# Patient Record
Sex: Male | Born: 1940 | Race: White | Hispanic: No | Marital: Married | Smoking: Former smoker
Health system: Southern US, Community
[De-identification: ages and names within clinical notes are randomized; demographics above are authoritative.]

## PROBLEM LIST (undated history)

## (undated) DIAGNOSIS — I10 Essential (primary) hypertension: Secondary | ICD-10-CM

## (undated) DIAGNOSIS — Z950 Presence of cardiac pacemaker: Secondary | ICD-10-CM

## (undated) DIAGNOSIS — I495 Sick sinus syndrome: Secondary | ICD-10-CM

## (undated) DIAGNOSIS — E785 Hyperlipidemia, unspecified: Secondary | ICD-10-CM

## (undated) DIAGNOSIS — H409 Unspecified glaucoma: Secondary | ICD-10-CM

## (undated) HISTORY — DX: Sick sinus syndrome: I49.5

## (undated) HISTORY — DX: Presence of cardiac pacemaker: Z95.0

---

## 2006-05-13 ENCOUNTER — Emergency Department (HOSPITAL_COMMUNITY): Admission: EM | Admit: 2006-05-13 | Discharge: 2006-05-13 | Payer: Self-pay | Admitting: Emergency Medicine

## 2006-10-14 ENCOUNTER — Encounter: Admission: RE | Admit: 2006-10-14 | Discharge: 2006-10-14 | Payer: Self-pay | Admitting: *Deleted

## 2008-07-14 ENCOUNTER — Encounter: Admission: RE | Admit: 2008-07-14 | Discharge: 2008-07-14 | Payer: Self-pay | Admitting: Family Medicine

## 2008-07-22 ENCOUNTER — Emergency Department (HOSPITAL_COMMUNITY): Admission: EM | Admit: 2008-07-22 | Discharge: 2008-07-22 | Payer: Self-pay | Admitting: Emergency Medicine

## 2008-08-10 ENCOUNTER — Encounter: Admission: RE | Admit: 2008-08-10 | Discharge: 2008-08-10 | Payer: Self-pay | Admitting: Rheumatology

## 2008-08-17 ENCOUNTER — Encounter (INDEPENDENT_AMBULATORY_CARE_PROVIDER_SITE_OTHER): Payer: Self-pay | Admitting: General Surgery

## 2008-08-17 ENCOUNTER — Ambulatory Visit (HOSPITAL_BASED_OUTPATIENT_CLINIC_OR_DEPARTMENT_OTHER): Admission: RE | Admit: 2008-08-17 | Discharge: 2008-08-17 | Payer: Self-pay | Admitting: General Surgery

## 2009-10-26 IMAGING — CT CT CHEST W/ CM
2 of 5 series · 13 of 32 positions shown, 18 images · IV contrast (30CC OMNI 350 & 80CC OMNI 300)
Comparison: None

CT CHEST

CLINICAL DATA: Fever of unknown origin and unexpected weight loss.

CT CHEST, ABDOMEN AND PELVIS WITH CONTRAST
TECHNIQUE: Multidetector CT imaging of the chest, abdomen and
pelvis was performed following the standard protocol during bolus
administration of intravenous contrast.
Contrast: 80 ml intravenous Bmnipaque-FVV

[Series 400: coronal · coronal · 1.21mm/px · 5 of 107 slices shown]
[im 18/107  soft-tissue]
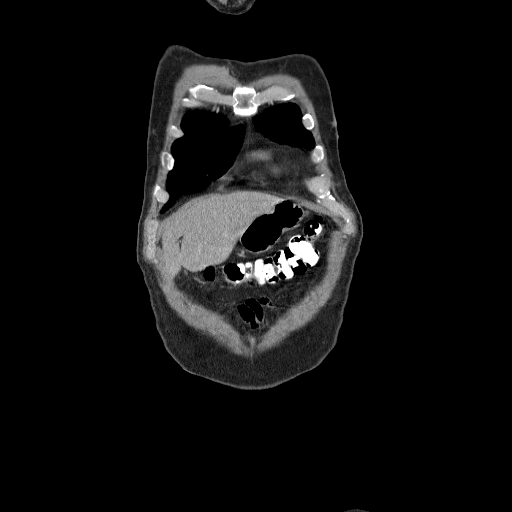
[im 36/107  soft-tissue]
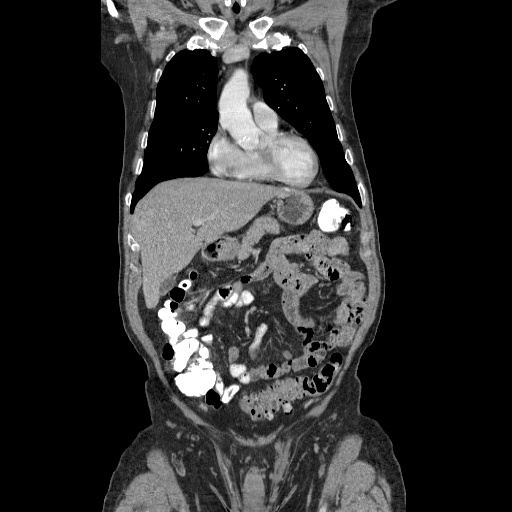
[im 54/107  soft-tissue]
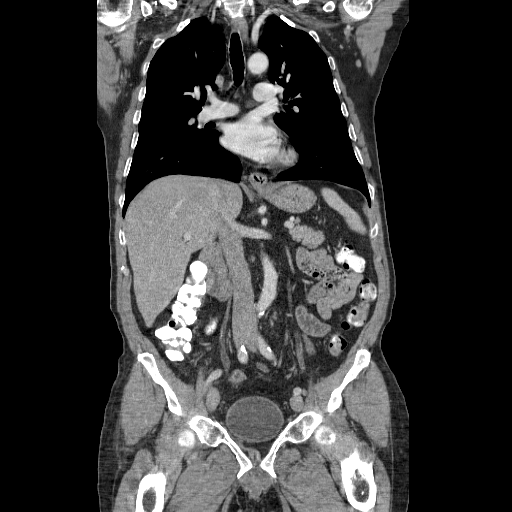
[im 71/107  soft-tissue]
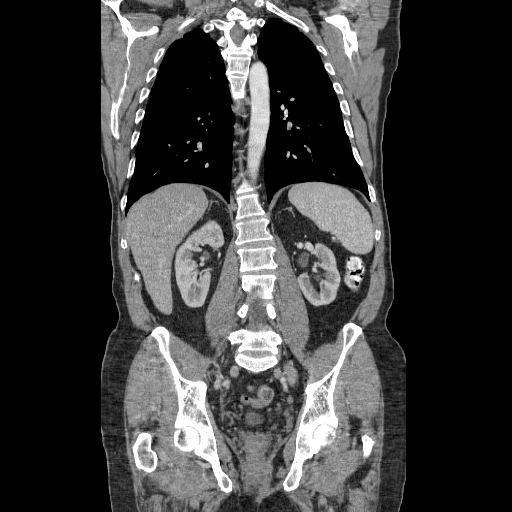
[im 89/107  soft-tissue]
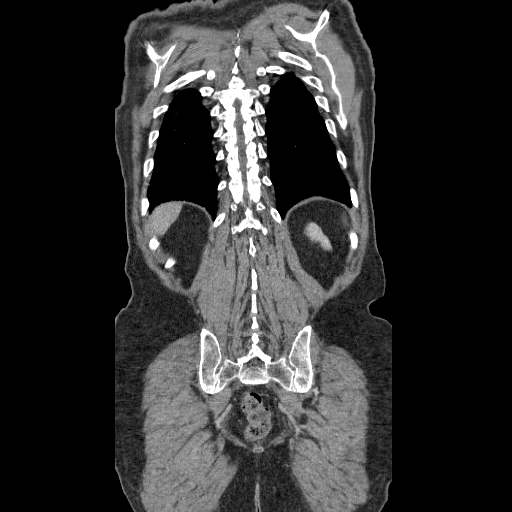

[Series 401: sagittal · sagittal · 1.21mm/px · 8 of 146 slices shown, 13 images]
[im 17/146  soft-tissue]
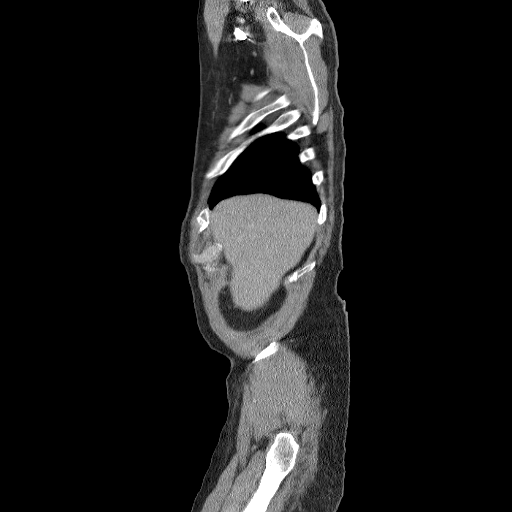
[im 17/146  lung]
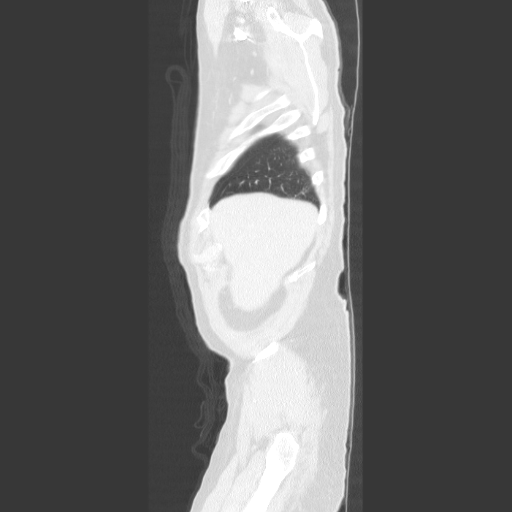
[im 17/146  bone]
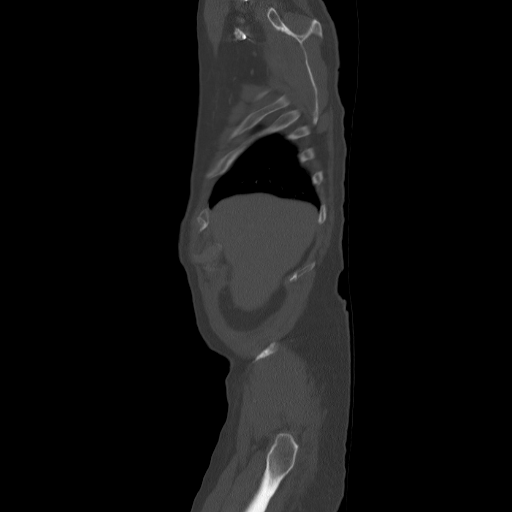
[im 33/146  soft-tissue]
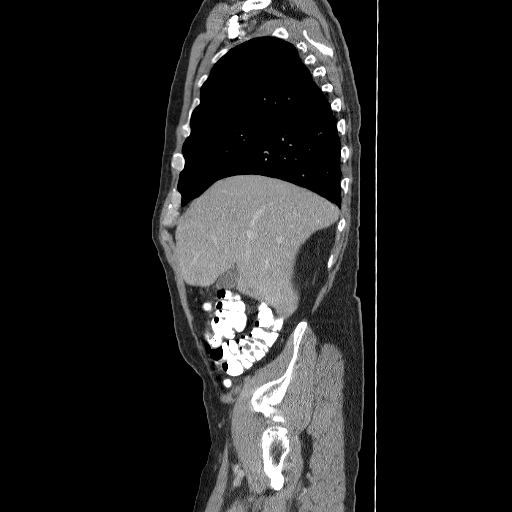
[im 33/146  lung]
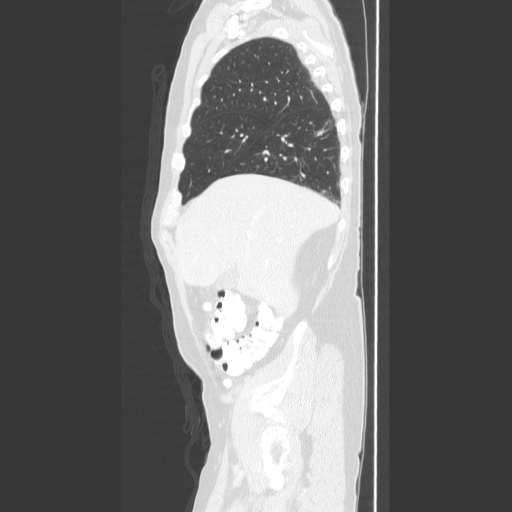
[im 49/146  soft-tissue]
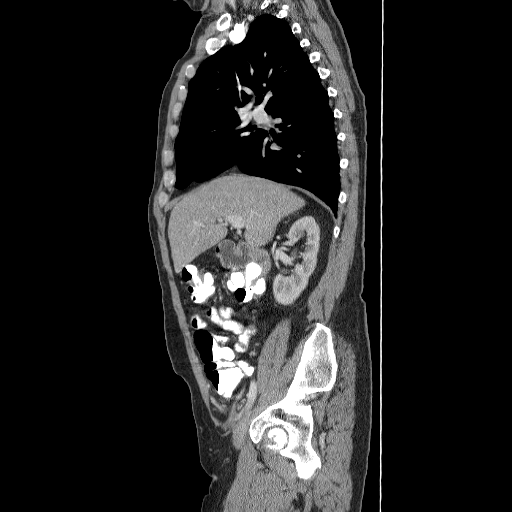
[im 49/146  lung]
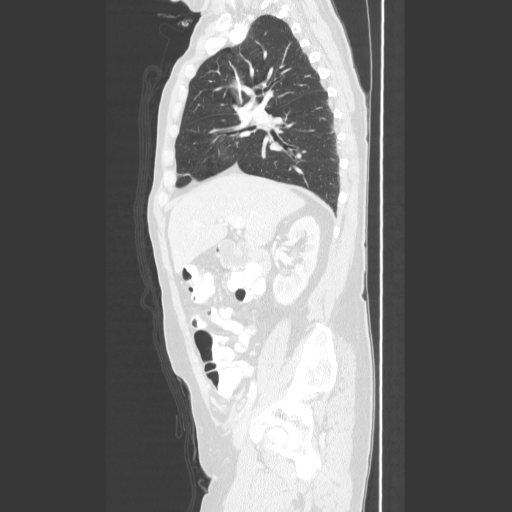
[im 65/146  soft-tissue]
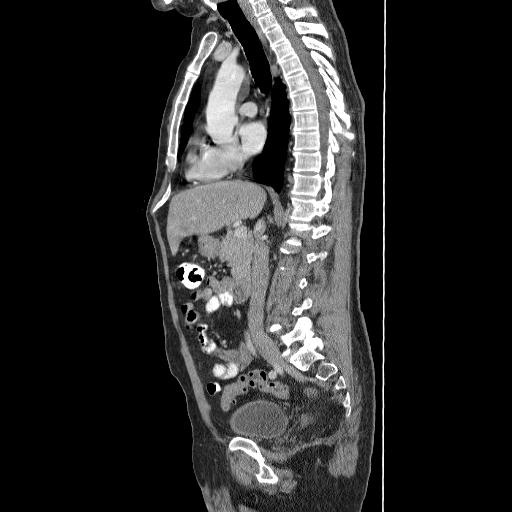
[im 65/146  lung]
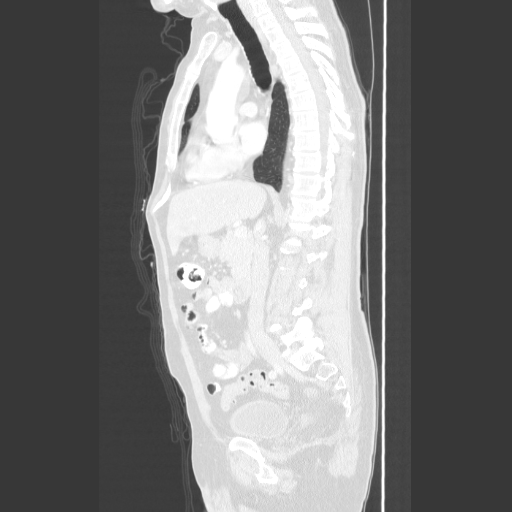
[im 81/146  soft-tissue]
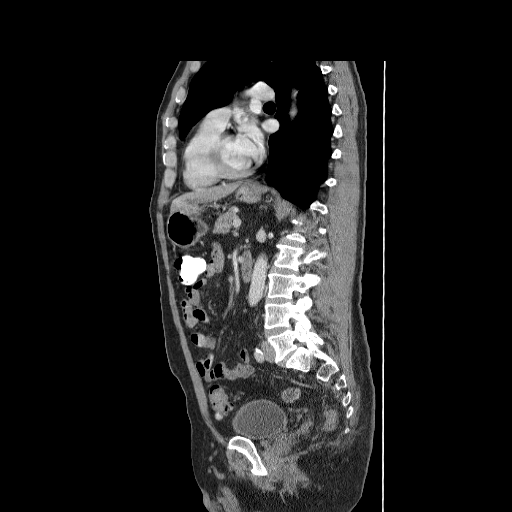
[im 97/146  soft-tissue]
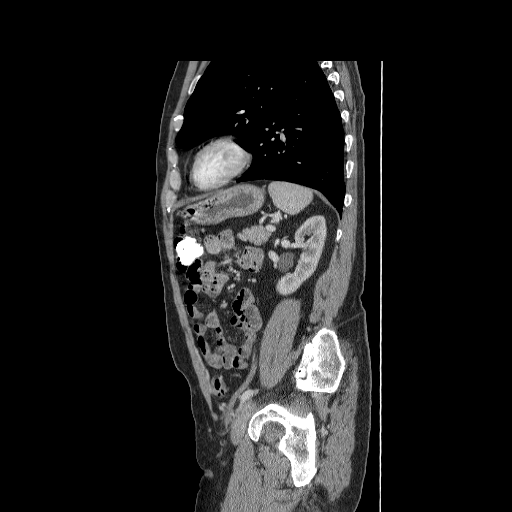
[im 113/146  soft-tissue]
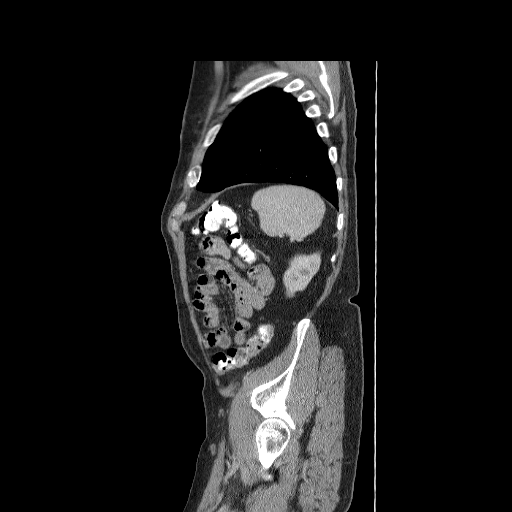
[im 129/146  soft-tissue]
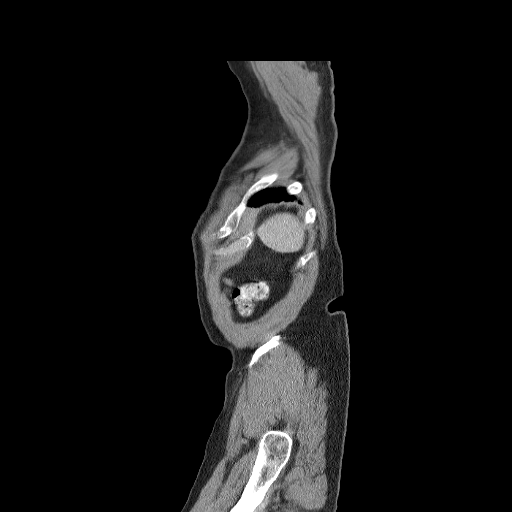

[13 of 32 positions shown; findings below may reference images not displayed]

FINDINGS: The heart and great vessels are within normal limits.
No pleural or pericardial effusions are identified.
There are no enlarged lymph nodes identified in the axillary,
mediastinal, or hilar regions.

The lungs are clear except for mild right middle lobe and right
lower lobe scarring.
No evidence of pulmonary nodules, masses, focal airspace disease,
or consolidation.

No acute or suspicious bony abnormalities are identified.
IMPRESSION: Minimal right lung scarring, otherwise unremarkable chest CT with
contrast.

CT ABDOMEN
FINDINGS: The liver, adrenal glands, pancreas, gallbladder, and
kidneys are unremarkable.
Spleen is upper limits of normal in size without focal lesions.
No free fluid, enlarged lymph nodes, biliary dilation or abdominal
aortic aneurysm identified.
A small hiatal hernia is noted.  The bowel is otherwise
unremarkable.

No acute or suspicious bony abnormalities are identified.
Bilateral L5 pars defects are noted without evidence of
spondylolisthesis.
IMPRESSION: No evidence of acute abnormality.

Upper limits normal spleen size.

Small hiatal hernia.

Bilateral L5 pars defects without spondylolisthesis.

CT PELVIS
FINDINGS: Colonic diverticulosis is noted without evidence of
diverticulitis.
The bowel is otherwise unremarkable.
No evidence of free fluid, enlarged lymph nodes, or urinary
calculi.
No acute or suspicious bony abnormalities identified.
IMPRESSION: No evidence of acute abnormality within the pelvis.

Colonic diverticulosis without evidence of diverticulitis.

## 2009-11-30 ENCOUNTER — Emergency Department (HOSPITAL_COMMUNITY): Admission: EM | Admit: 2009-11-30 | Discharge: 2009-11-30 | Payer: Self-pay | Admitting: Family Medicine

## 2010-04-02 LAB — CBC
Hemoglobin: 16.1 g/dL (ref 13.0–17.0)
MCH: 31.8 pg (ref 26.0–34.0)
MCHC: 34.5 g/dL (ref 30.0–36.0)
Platelets: 215 10*3/uL (ref 150–400)
RDW: 13.2 % (ref 11.5–15.5)

## 2010-04-02 LAB — DIFFERENTIAL
Basophils Absolute: 0.1 10*3/uL (ref 0.0–0.1)
Basophils Relative: 1 % (ref 0–1)
Eosinophils Absolute: 0.1 10*3/uL (ref 0.0–0.7)
Monocytes Relative: 9 % (ref 3–12)
Neutro Abs: 2.9 10*3/uL (ref 1.7–7.7)
Neutrophils Relative %: 57 % (ref 43–77)

## 2010-04-02 LAB — C-REACTIVE PROTEIN: CRP: 0.5 mg/dL — ABNORMAL LOW (ref <0.6)

## 2010-04-28 LAB — DIFFERENTIAL
Basophils Relative: 0 % (ref 0–1)
Basophils Relative: 0 % (ref 0–1)
Eosinophils Absolute: 0 10*3/uL (ref 0.0–0.7)
Lymphocytes Relative: 17 % (ref 12–46)
Lymphs Abs: 1.7 10*3/uL (ref 0.7–4.0)
Monocytes Relative: 5 % (ref 3–12)
Neutro Abs: 7.5 10*3/uL (ref 1.7–7.7)
Neutro Abs: 7.8 10*3/uL — ABNORMAL HIGH (ref 1.7–7.7)
Neutrophils Relative %: 76 % (ref 43–77)
Neutrophils Relative %: 77 % (ref 43–77)

## 2010-04-28 LAB — BASIC METABOLIC PANEL
BUN: 23 mg/dL (ref 6–23)
CO2: 30 mEq/L (ref 19–32)
Calcium: 9.2 mg/dL (ref 8.4–10.5)
Chloride: 102 mEq/L (ref 96–112)
Creatinine, Ser: 1.4 mg/dL (ref 0.4–1.5)

## 2010-04-28 LAB — CBC
MCHC: 34.2 g/dL (ref 30.0–36.0)
MCHC: 34.4 g/dL (ref 30.0–36.0)
MCV: 88.3 fL (ref 78.0–100.0)
Platelets: 523 10*3/uL — ABNORMAL HIGH (ref 150–400)
Platelets: 327 10*3/uL (ref 150–400)
RDW: 12.8 % (ref 11.5–15.5)
WBC: 9.8 10*3/uL (ref 4.0–10.5)

## 2010-04-28 LAB — COMPREHENSIVE METABOLIC PANEL
Alkaline Phosphatase: 70 U/L (ref 39–117)
BUN: 21 mg/dL (ref 6–23)
Calcium: 9.3 mg/dL (ref 8.4–10.5)
Creatinine, Ser: 1.31 mg/dL (ref 0.4–1.5)
Glucose, Bld: 164 mg/dL — ABNORMAL HIGH (ref 70–99)
Total Protein: 6.9 g/dL (ref 6.0–8.3)

## 2010-04-28 LAB — CK TOTAL AND CKMB (NOT AT ARMC)
Relative Index: INVALID (ref 0.0–2.5)
Total CK: 28 U/L (ref 7–232)

## 2010-04-28 LAB — PROTIME-INR
INR: 1 (ref 0.00–1.49)
Prothrombin Time: 13.6 seconds (ref 11.6–15.2)

## 2010-06-04 NOTE — Op Note (Signed)
NAMEHARLIS, William Daniels               ACCOUNT NO.:  [id number]   MEDICAL RECORD NO.:  [id number]          PATIENT TYPE:  AMB   LOCATION:  [facility]SC                          FACILITY:  [facility]   PHYSICIAN:  Almond Lint, MD       DATE OF BIRTH:  09-23-1940   DATE OF PROCEDURE:  08/17/2008  DATE OF DISCHARGE:                               OPERATIVE REPORT   PREOPERATIVE DIAGNOSIS:  Headaches and fever of unknown origin.   POSTOPERATIVE DIAGNOSIS:  Headaches and fever of unknown origin.   PROCEDURE PERFORMED:  Right temporal artery biopsy.   SURGEON:  Almond Lint, MD   ASSISTANT:  None.   ANESTHESIA:  MAC and local.   FINDINGS:  Sclerotic right temporal artery.   SPECIMEN:  A 2-cm temporal artery, sample to pathology.   ESTIMATED BLOOD LOSS:  Minimal.   COMPLICATIONS:  None known.   PROCEDURE:  Mr. Tomaro was identified in the holding area and taken to  operating room where he was placed supine on the operating room table.  MAC anesthesia was induced.  Head was rotated to the left and the  Doppler was used to mark out the course of the temporal artery.  This  will right along its right hairline, so the hair was clipped.  The  patient's temple was prepped and draped in sterile fashion.  Time-out  was performed according to the surgical safety check list.  When all was  correct, we continued.  A 1% lidocaine plain mixed with 0.25% Marcaine  with epinephrine was used to anesthetize the skin incision.  A #15 blade  was used to make a 3.5-cm incision.  The Bovie electrocautery was used  to divide through the subcutaneous fat.  The tenotomy scissors were used  to skeletonize the temporal artery.  Care was taken not to leave any  branches bleeding, as he has been on aspirin therapy.  This was  dissected out to a length of around 2.5 cm and then mosquito clamps were  placed on either end.  These were tied off and then the temporal artery  was transected with Metzenbaum scissors.  The inferior  tie came off, and  so this was reclamped and tied.  Specimen was passed off the table.  Care was taken to incorporate only the temporal artery and  the skeletonization.  The facial nerve was not seen.  The skin was then  irrigated and then closed with 3-0 Vicryl and 4-0 Monocryl.  Wound was  then cleaned, dried, and dressed with Dermabond.  The patient did well  throughout the procedure and remained awake at the end.      Almond Lint, MD  Electronically Signed     FB/MEDQ  D:  08/17/2008  T:  08/17/2008  Job:  [id number]

## 2011-01-27 DIAGNOSIS — Z961 Presence of intraocular lens: Secondary | ICD-10-CM | POA: Diagnosis not present

## 2011-01-27 DIAGNOSIS — H259 Unspecified age-related cataract: Secondary | ICD-10-CM | POA: Diagnosis not present

## 2011-03-12 DIAGNOSIS — E119 Type 2 diabetes mellitus without complications: Secondary | ICD-10-CM | POA: Diagnosis not present

## 2011-03-12 DIAGNOSIS — M899 Disorder of bone, unspecified: Secondary | ICD-10-CM | POA: Diagnosis not present

## 2011-03-12 DIAGNOSIS — M316 Other giant cell arteritis: Secondary | ICD-10-CM | POA: Diagnosis not present

## 2011-03-12 DIAGNOSIS — Z1331 Encounter for screening for depression: Secondary | ICD-10-CM | POA: Diagnosis not present

## 2011-03-12 DIAGNOSIS — E7800 Pure hypercholesterolemia, unspecified: Secondary | ICD-10-CM | POA: Diagnosis not present

## 2011-03-12 DIAGNOSIS — Z1330 Encounter for screening examination for mental health and behavioral disorders, unspecified: Secondary | ICD-10-CM | POA: Diagnosis not present

## 2011-03-12 DIAGNOSIS — Z125 Encounter for screening for malignant neoplasm of prostate: Secondary | ICD-10-CM | POA: Diagnosis not present

## 2011-03-12 DIAGNOSIS — Z0000 Encounter for general adult medical examination without abnormal findings: Secondary | ICD-10-CM | POA: Diagnosis not present

## 2011-03-12 DIAGNOSIS — M949 Disorder of cartilage, unspecified: Secondary | ICD-10-CM | POA: Diagnosis not present

## 2011-04-16 DIAGNOSIS — M949 Disorder of cartilage, unspecified: Secondary | ICD-10-CM | POA: Diagnosis not present

## 2011-04-16 DIAGNOSIS — M899 Disorder of bone, unspecified: Secondary | ICD-10-CM | POA: Diagnosis not present

## 2011-06-20 DIAGNOSIS — D239 Other benign neoplasm of skin, unspecified: Secondary | ICD-10-CM | POA: Diagnosis not present

## 2011-06-20 DIAGNOSIS — L821 Other seborrheic keratosis: Secondary | ICD-10-CM | POA: Diagnosis not present

## 2011-06-20 DIAGNOSIS — L259 Unspecified contact dermatitis, unspecified cause: Secondary | ICD-10-CM | POA: Diagnosis not present

## 2011-06-20 DIAGNOSIS — L819 Disorder of pigmentation, unspecified: Secondary | ICD-10-CM | POA: Diagnosis not present

## 2011-06-20 DIAGNOSIS — D1801 Hemangioma of skin and subcutaneous tissue: Secondary | ICD-10-CM | POA: Diagnosis not present

## 2011-06-20 DIAGNOSIS — L570 Actinic keratosis: Secondary | ICD-10-CM | POA: Diagnosis not present

## 2011-07-28 DIAGNOSIS — H02409 Unspecified ptosis of unspecified eyelid: Secondary | ICD-10-CM | POA: Diagnosis not present

## 2011-07-28 DIAGNOSIS — H4011X0 Primary open-angle glaucoma, stage unspecified: Secondary | ICD-10-CM | POA: Diagnosis not present

## 2011-07-28 DIAGNOSIS — H259 Unspecified age-related cataract: Secondary | ICD-10-CM | POA: Diagnosis not present

## 2011-07-28 DIAGNOSIS — H409 Unspecified glaucoma: Secondary | ICD-10-CM | POA: Diagnosis not present

## 2011-09-09 DIAGNOSIS — E782 Mixed hyperlipidemia: Secondary | ICD-10-CM | POA: Diagnosis not present

## 2011-09-09 DIAGNOSIS — E119 Type 2 diabetes mellitus without complications: Secondary | ICD-10-CM | POA: Diagnosis not present

## 2011-09-09 DIAGNOSIS — E7800 Pure hypercholesterolemia, unspecified: Secondary | ICD-10-CM | POA: Diagnosis not present

## 2011-11-04 DIAGNOSIS — Z23 Encounter for immunization: Secondary | ICD-10-CM | POA: Diagnosis not present

## 2011-11-10 DIAGNOSIS — L570 Actinic keratosis: Secondary | ICD-10-CM | POA: Diagnosis not present

## 2012-01-08 DIAGNOSIS — L570 Actinic keratosis: Secondary | ICD-10-CM | POA: Diagnosis not present

## 2012-01-22 DIAGNOSIS — H4011X0 Primary open-angle glaucoma, stage unspecified: Secondary | ICD-10-CM | POA: Diagnosis not present

## 2012-01-22 DIAGNOSIS — H2640 Unspecified secondary cataract: Secondary | ICD-10-CM | POA: Diagnosis not present

## 2012-01-22 DIAGNOSIS — H02409 Unspecified ptosis of unspecified eyelid: Secondary | ICD-10-CM | POA: Diagnosis not present

## 2012-01-22 DIAGNOSIS — H409 Unspecified glaucoma: Secondary | ICD-10-CM | POA: Diagnosis not present

## 2012-04-05 DIAGNOSIS — H409 Unspecified glaucoma: Secondary | ICD-10-CM | POA: Diagnosis not present

## 2012-04-05 DIAGNOSIS — H259 Unspecified age-related cataract: Secondary | ICD-10-CM | POA: Diagnosis not present

## 2012-04-05 DIAGNOSIS — H4011X0 Primary open-angle glaucoma, stage unspecified: Secondary | ICD-10-CM | POA: Diagnosis not present

## 2012-04-05 DIAGNOSIS — E119 Type 2 diabetes mellitus without complications: Secondary | ICD-10-CM | POA: Diagnosis not present

## 2012-04-29 DIAGNOSIS — M899 Disorder of bone, unspecified: Secondary | ICD-10-CM | POA: Diagnosis not present

## 2012-04-29 DIAGNOSIS — M316 Other giant cell arteritis: Secondary | ICD-10-CM | POA: Diagnosis not present

## 2012-04-29 DIAGNOSIS — E119 Type 2 diabetes mellitus without complications: Secondary | ICD-10-CM | POA: Diagnosis not present

## 2012-04-29 DIAGNOSIS — M353 Polymyalgia rheumatica: Secondary | ICD-10-CM | POA: Diagnosis not present

## 2012-04-29 DIAGNOSIS — Z0000 Encounter for general adult medical examination without abnormal findings: Secondary | ICD-10-CM | POA: Diagnosis not present

## 2012-04-29 DIAGNOSIS — K219 Gastro-esophageal reflux disease without esophagitis: Secondary | ICD-10-CM | POA: Diagnosis not present

## 2012-04-29 DIAGNOSIS — Z125 Encounter for screening for malignant neoplasm of prostate: Secondary | ICD-10-CM | POA: Diagnosis not present

## 2012-04-29 DIAGNOSIS — M949 Disorder of cartilage, unspecified: Secondary | ICD-10-CM | POA: Diagnosis not present

## 2012-04-29 DIAGNOSIS — J309 Allergic rhinitis, unspecified: Secondary | ICD-10-CM | POA: Diagnosis not present

## 2012-04-29 DIAGNOSIS — E782 Mixed hyperlipidemia: Secondary | ICD-10-CM | POA: Diagnosis not present

## 2012-05-06 DIAGNOSIS — H26499 Other secondary cataract, unspecified eye: Secondary | ICD-10-CM | POA: Diagnosis not present

## 2012-05-06 DIAGNOSIS — H2640 Unspecified secondary cataract: Secondary | ICD-10-CM | POA: Diagnosis not present

## 2012-06-22 DIAGNOSIS — H35419 Lattice degeneration of retina, unspecified eye: Secondary | ICD-10-CM | POA: Diagnosis not present

## 2012-06-24 DIAGNOSIS — D1801 Hemangioma of skin and subcutaneous tissue: Secondary | ICD-10-CM | POA: Diagnosis not present

## 2012-06-24 DIAGNOSIS — L570 Actinic keratosis: Secondary | ICD-10-CM | POA: Diagnosis not present

## 2012-06-24 DIAGNOSIS — L821 Other seborrheic keratosis: Secondary | ICD-10-CM | POA: Diagnosis not present

## 2012-06-24 DIAGNOSIS — L820 Inflamed seborrheic keratosis: Secondary | ICD-10-CM | POA: Diagnosis not present

## 2012-06-24 DIAGNOSIS — L819 Disorder of pigmentation, unspecified: Secondary | ICD-10-CM | POA: Diagnosis not present

## 2012-06-24 DIAGNOSIS — D239 Other benign neoplasm of skin, unspecified: Secondary | ICD-10-CM | POA: Diagnosis not present

## 2012-06-24 DIAGNOSIS — D485 Neoplasm of uncertain behavior of skin: Secondary | ICD-10-CM | POA: Diagnosis not present

## 2012-06-28 DIAGNOSIS — D485 Neoplasm of uncertain behavior of skin: Secondary | ICD-10-CM | POA: Diagnosis not present

## 2012-07-06 DIAGNOSIS — H2510 Age-related nuclear cataract, unspecified eye: Secondary | ICD-10-CM | POA: Diagnosis not present

## 2012-07-06 DIAGNOSIS — H269 Unspecified cataract: Secondary | ICD-10-CM | POA: Diagnosis not present

## 2012-07-06 DIAGNOSIS — H4011X0 Primary open-angle glaucoma, stage unspecified: Secondary | ICD-10-CM | POA: Diagnosis not present

## 2012-07-06 DIAGNOSIS — H25039 Anterior subcapsular polar age-related cataract, unspecified eye: Secondary | ICD-10-CM | POA: Diagnosis not present

## 2012-07-06 DIAGNOSIS — H25049 Posterior subcapsular polar age-related cataract, unspecified eye: Secondary | ICD-10-CM | POA: Diagnosis not present

## 2012-07-06 DIAGNOSIS — H409 Unspecified glaucoma: Secondary | ICD-10-CM | POA: Diagnosis not present

## 2012-10-18 DIAGNOSIS — H16109 Unspecified superficial keratitis, unspecified eye: Secondary | ICD-10-CM | POA: Diagnosis not present

## 2012-10-18 DIAGNOSIS — H4011X0 Primary open-angle glaucoma, stage unspecified: Secondary | ICD-10-CM | POA: Diagnosis not present

## 2012-10-18 DIAGNOSIS — H409 Unspecified glaucoma: Secondary | ICD-10-CM | POA: Diagnosis not present

## 2012-10-18 DIAGNOSIS — H5340 Unspecified visual field defects: Secondary | ICD-10-CM | POA: Diagnosis not present

## 2012-11-04 DIAGNOSIS — Z23 Encounter for immunization: Secondary | ICD-10-CM | POA: Diagnosis not present

## 2012-11-04 DIAGNOSIS — E119 Type 2 diabetes mellitus without complications: Secondary | ICD-10-CM | POA: Diagnosis not present

## 2012-11-04 DIAGNOSIS — E782 Mixed hyperlipidemia: Secondary | ICD-10-CM | POA: Diagnosis not present

## 2012-11-04 DIAGNOSIS — N183 Chronic kidney disease, stage 3 unspecified: Secondary | ICD-10-CM | POA: Diagnosis not present

## 2012-12-21 DIAGNOSIS — L570 Actinic keratosis: Secondary | ICD-10-CM | POA: Diagnosis not present

## 2013-02-23 DIAGNOSIS — L570 Actinic keratosis: Secondary | ICD-10-CM | POA: Diagnosis not present

## 2013-02-24 DIAGNOSIS — H0019 Chalazion unspecified eye, unspecified eyelid: Secondary | ICD-10-CM | POA: Diagnosis not present

## 2013-02-24 DIAGNOSIS — H4011X0 Primary open-angle glaucoma, stage unspecified: Secondary | ICD-10-CM | POA: Diagnosis not present

## 2013-02-24 DIAGNOSIS — H01009 Unspecified blepharitis unspecified eye, unspecified eyelid: Secondary | ICD-10-CM | POA: Diagnosis not present

## 2013-02-24 DIAGNOSIS — H409 Unspecified glaucoma: Secondary | ICD-10-CM | POA: Diagnosis not present

## 2013-05-04 DIAGNOSIS — M353 Polymyalgia rheumatica: Secondary | ICD-10-CM | POA: Diagnosis not present

## 2013-05-04 DIAGNOSIS — Z0000 Encounter for general adult medical examination without abnormal findings: Secondary | ICD-10-CM | POA: Diagnosis not present

## 2013-05-04 DIAGNOSIS — E782 Mixed hyperlipidemia: Secondary | ICD-10-CM | POA: Diagnosis not present

## 2013-05-04 DIAGNOSIS — N183 Chronic kidney disease, stage 3 unspecified: Secondary | ICD-10-CM | POA: Diagnosis not present

## 2013-05-04 DIAGNOSIS — M316 Other giant cell arteritis: Secondary | ICD-10-CM | POA: Diagnosis not present

## 2013-05-04 DIAGNOSIS — M899 Disorder of bone, unspecified: Secondary | ICD-10-CM | POA: Diagnosis not present

## 2013-05-04 DIAGNOSIS — K219 Gastro-esophageal reflux disease without esophagitis: Secondary | ICD-10-CM | POA: Diagnosis not present

## 2013-05-04 DIAGNOSIS — Z23 Encounter for immunization: Secondary | ICD-10-CM | POA: Diagnosis not present

## 2013-05-04 DIAGNOSIS — E119 Type 2 diabetes mellitus without complications: Secondary | ICD-10-CM | POA: Diagnosis not present

## 2013-05-19 DIAGNOSIS — M899 Disorder of bone, unspecified: Secondary | ICD-10-CM | POA: Diagnosis not present

## 2013-05-19 DIAGNOSIS — M949 Disorder of cartilage, unspecified: Secondary | ICD-10-CM | POA: Diagnosis not present

## 2013-07-05 DIAGNOSIS — L819 Disorder of pigmentation, unspecified: Secondary | ICD-10-CM | POA: Diagnosis not present

## 2013-07-05 DIAGNOSIS — L570 Actinic keratosis: Secondary | ICD-10-CM | POA: Diagnosis not present

## 2013-07-05 DIAGNOSIS — L821 Other seborrheic keratosis: Secondary | ICD-10-CM | POA: Diagnosis not present

## 2013-07-05 DIAGNOSIS — D1801 Hemangioma of skin and subcutaneous tissue: Secondary | ICD-10-CM | POA: Diagnosis not present

## 2013-07-05 DIAGNOSIS — D239 Other benign neoplasm of skin, unspecified: Secondary | ICD-10-CM | POA: Diagnosis not present

## 2013-07-15 DIAGNOSIS — H02409 Unspecified ptosis of unspecified eyelid: Secondary | ICD-10-CM | POA: Diagnosis not present

## 2013-07-15 DIAGNOSIS — H4011X0 Primary open-angle glaucoma, stage unspecified: Secondary | ICD-10-CM | POA: Diagnosis not present

## 2013-07-15 DIAGNOSIS — Z961 Presence of intraocular lens: Secondary | ICD-10-CM | POA: Diagnosis not present

## 2013-11-03 DIAGNOSIS — E782 Mixed hyperlipidemia: Secondary | ICD-10-CM | POA: Diagnosis not present

## 2013-11-03 DIAGNOSIS — Z23 Encounter for immunization: Secondary | ICD-10-CM | POA: Diagnosis not present

## 2013-11-03 DIAGNOSIS — E1122 Type 2 diabetes mellitus with diabetic chronic kidney disease: Secondary | ICD-10-CM | POA: Diagnosis not present

## 2013-11-03 DIAGNOSIS — N183 Chronic kidney disease, stage 3 (moderate): Secondary | ICD-10-CM | POA: Diagnosis not present

## 2013-11-23 DIAGNOSIS — H4011X2 Primary open-angle glaucoma, moderate stage: Secondary | ICD-10-CM | POA: Diagnosis not present

## 2013-11-28 DIAGNOSIS — E119 Type 2 diabetes mellitus without complications: Secondary | ICD-10-CM | POA: Diagnosis not present

## 2013-11-28 DIAGNOSIS — H4011X2 Primary open-angle glaucoma, moderate stage: Secondary | ICD-10-CM | POA: Diagnosis not present

## 2013-11-28 DIAGNOSIS — H01001 Unspecified blepharitis right upper eyelid: Secondary | ICD-10-CM | POA: Diagnosis not present

## 2013-11-28 DIAGNOSIS — H31002 Unspecified chorioretinal scars, left eye: Secondary | ICD-10-CM | POA: Diagnosis not present

## 2014-02-19 DIAGNOSIS — J069 Acute upper respiratory infection, unspecified: Secondary | ICD-10-CM | POA: Diagnosis not present

## 2014-05-11 DIAGNOSIS — H02055 Trichiasis without entropian left lower eyelid: Secondary | ICD-10-CM | POA: Diagnosis not present

## 2014-05-11 DIAGNOSIS — H01001 Unspecified blepharitis right upper eyelid: Secondary | ICD-10-CM | POA: Diagnosis not present

## 2014-05-11 DIAGNOSIS — H16102 Unspecified superficial keratitis, left eye: Secondary | ICD-10-CM | POA: Diagnosis not present

## 2014-05-11 DIAGNOSIS — H4011X2 Primary open-angle glaucoma, moderate stage: Secondary | ICD-10-CM | POA: Diagnosis not present

## 2014-05-15 DIAGNOSIS — Z1389 Encounter for screening for other disorder: Secondary | ICD-10-CM | POA: Diagnosis not present

## 2014-05-15 DIAGNOSIS — E1122 Type 2 diabetes mellitus with diabetic chronic kidney disease: Secondary | ICD-10-CM | POA: Diagnosis not present

## 2014-05-15 DIAGNOSIS — Z125 Encounter for screening for malignant neoplasm of prostate: Secondary | ICD-10-CM | POA: Diagnosis not present

## 2014-05-15 DIAGNOSIS — E782 Mixed hyperlipidemia: Secondary | ICD-10-CM | POA: Diagnosis not present

## 2014-05-15 DIAGNOSIS — M353 Polymyalgia rheumatica: Secondary | ICD-10-CM | POA: Diagnosis not present

## 2014-05-15 DIAGNOSIS — Z0000 Encounter for general adult medical examination without abnormal findings: Secondary | ICD-10-CM | POA: Diagnosis not present

## 2014-05-15 DIAGNOSIS — N183 Chronic kidney disease, stage 3 (moderate): Secondary | ICD-10-CM | POA: Diagnosis not present

## 2014-05-15 DIAGNOSIS — K219 Gastro-esophageal reflux disease without esophagitis: Secondary | ICD-10-CM | POA: Diagnosis not present

## 2014-05-15 DIAGNOSIS — J301 Allergic rhinitis due to pollen: Secondary | ICD-10-CM | POA: Diagnosis not present

## 2014-05-15 DIAGNOSIS — M859 Disorder of bone density and structure, unspecified: Secondary | ICD-10-CM | POA: Diagnosis not present

## 2014-07-07 DIAGNOSIS — D485 Neoplasm of uncertain behavior of skin: Secondary | ICD-10-CM | POA: Diagnosis not present

## 2014-07-07 DIAGNOSIS — L821 Other seborrheic keratosis: Secondary | ICD-10-CM | POA: Diagnosis not present

## 2014-07-07 DIAGNOSIS — L281 Prurigo nodularis: Secondary | ICD-10-CM | POA: Diagnosis not present

## 2014-07-07 DIAGNOSIS — L570 Actinic keratosis: Secondary | ICD-10-CM | POA: Diagnosis not present

## 2014-07-07 DIAGNOSIS — L814 Other melanin hyperpigmentation: Secondary | ICD-10-CM | POA: Diagnosis not present

## 2014-07-07 DIAGNOSIS — L309 Dermatitis, unspecified: Secondary | ICD-10-CM | POA: Diagnosis not present

## 2014-07-07 DIAGNOSIS — D1801 Hemangioma of skin and subcutaneous tissue: Secondary | ICD-10-CM | POA: Diagnosis not present

## 2014-10-05 DIAGNOSIS — Z87891 Personal history of nicotine dependence: Secondary | ICD-10-CM | POA: Diagnosis not present

## 2014-10-05 DIAGNOSIS — K219 Gastro-esophageal reflux disease without esophagitis: Secondary | ICD-10-CM | POA: Diagnosis not present

## 2014-10-05 DIAGNOSIS — E119 Type 2 diabetes mellitus without complications: Secondary | ICD-10-CM | POA: Diagnosis not present

## 2014-10-05 DIAGNOSIS — Z79899 Other long term (current) drug therapy: Secondary | ICD-10-CM | POA: Diagnosis not present

## 2014-10-05 DIAGNOSIS — Z83511 Family history of glaucoma: Secondary | ICD-10-CM | POA: Diagnosis not present

## 2014-10-05 DIAGNOSIS — K5730 Diverticulosis of large intestine without perforation or abscess without bleeding: Secondary | ICD-10-CM | POA: Diagnosis not present

## 2014-10-05 DIAGNOSIS — Z87442 Personal history of urinary calculi: Secondary | ICD-10-CM | POA: Diagnosis not present

## 2014-10-05 DIAGNOSIS — Z86010 Personal history of colonic polyps: Secondary | ICD-10-CM | POA: Diagnosis not present

## 2014-10-05 DIAGNOSIS — E785 Hyperlipidemia, unspecified: Secondary | ICD-10-CM | POA: Diagnosis not present

## 2014-10-05 DIAGNOSIS — Z800 Family history of malignant neoplasm of digestive organs: Secondary | ICD-10-CM | POA: Diagnosis not present

## 2014-10-05 DIAGNOSIS — M1990 Unspecified osteoarthritis, unspecified site: Secondary | ICD-10-CM | POA: Diagnosis not present

## 2014-10-05 DIAGNOSIS — Z7982 Long term (current) use of aspirin: Secondary | ICD-10-CM | POA: Diagnosis not present

## 2014-10-12 DIAGNOSIS — H01001 Unspecified blepharitis right upper eyelid: Secondary | ICD-10-CM | POA: Diagnosis not present

## 2014-10-12 DIAGNOSIS — Z961 Presence of intraocular lens: Secondary | ICD-10-CM | POA: Diagnosis not present

## 2014-10-12 DIAGNOSIS — H4011X2 Primary open-angle glaucoma, moderate stage: Secondary | ICD-10-CM | POA: Diagnosis not present

## 2014-10-12 DIAGNOSIS — H01004 Unspecified blepharitis left upper eyelid: Secondary | ICD-10-CM | POA: Diagnosis not present

## 2014-11-14 DIAGNOSIS — N183 Chronic kidney disease, stage 3 (moderate): Secondary | ICD-10-CM | POA: Diagnosis not present

## 2014-11-14 DIAGNOSIS — E1122 Type 2 diabetes mellitus with diabetic chronic kidney disease: Secondary | ICD-10-CM | POA: Diagnosis not present

## 2014-11-14 DIAGNOSIS — M353 Polymyalgia rheumatica: Secondary | ICD-10-CM | POA: Diagnosis not present

## 2014-11-14 DIAGNOSIS — E782 Mixed hyperlipidemia: Secondary | ICD-10-CM | POA: Diagnosis not present

## 2014-11-14 DIAGNOSIS — Z23 Encounter for immunization: Secondary | ICD-10-CM | POA: Diagnosis not present

## 2014-11-14 DIAGNOSIS — D692 Other nonthrombocytopenic purpura: Secondary | ICD-10-CM | POA: Diagnosis not present

## 2014-12-04 DIAGNOSIS — L600 Ingrowing nail: Secondary | ICD-10-CM | POA: Diagnosis not present

## 2014-12-13 DIAGNOSIS — L600 Ingrowing nail: Secondary | ICD-10-CM | POA: Diagnosis not present

## 2015-02-05 DIAGNOSIS — H401112 Primary open-angle glaucoma, right eye, moderate stage: Secondary | ICD-10-CM | POA: Diagnosis not present

## 2015-02-05 DIAGNOSIS — H401122 Primary open-angle glaucoma, left eye, moderate stage: Secondary | ICD-10-CM | POA: Diagnosis not present

## 2015-02-16 DIAGNOSIS — H401131 Primary open-angle glaucoma, bilateral, mild stage: Secondary | ICD-10-CM | POA: Diagnosis not present

## 2015-02-16 DIAGNOSIS — Z961 Presence of intraocular lens: Secondary | ICD-10-CM | POA: Diagnosis not present

## 2015-02-16 DIAGNOSIS — H04123 Dry eye syndrome of bilateral lacrimal glands: Secondary | ICD-10-CM | POA: Diagnosis not present

## 2015-06-22 DIAGNOSIS — Z961 Presence of intraocular lens: Secondary | ICD-10-CM | POA: Diagnosis not present

## 2015-06-22 DIAGNOSIS — H401132 Primary open-angle glaucoma, bilateral, moderate stage: Secondary | ICD-10-CM | POA: Diagnosis not present

## 2015-07-11 DIAGNOSIS — L814 Other melanin hyperpigmentation: Secondary | ICD-10-CM | POA: Diagnosis not present

## 2015-07-11 DIAGNOSIS — L570 Actinic keratosis: Secondary | ICD-10-CM | POA: Diagnosis not present

## 2015-07-11 DIAGNOSIS — L821 Other seborrheic keratosis: Secondary | ICD-10-CM | POA: Diagnosis not present

## 2015-07-11 DIAGNOSIS — D1801 Hemangioma of skin and subcutaneous tissue: Secondary | ICD-10-CM | POA: Diagnosis not present

## 2015-08-09 DIAGNOSIS — Z125 Encounter for screening for malignant neoplasm of prostate: Secondary | ICD-10-CM | POA: Diagnosis not present

## 2015-08-09 DIAGNOSIS — E782 Mixed hyperlipidemia: Secondary | ICD-10-CM | POA: Diagnosis not present

## 2015-08-09 DIAGNOSIS — M353 Polymyalgia rheumatica: Secondary | ICD-10-CM | POA: Diagnosis not present

## 2015-08-09 DIAGNOSIS — N183 Chronic kidney disease, stage 3 (moderate): Secondary | ICD-10-CM | POA: Diagnosis not present

## 2015-08-09 DIAGNOSIS — J309 Allergic rhinitis, unspecified: Secondary | ICD-10-CM | POA: Diagnosis not present

## 2015-08-09 DIAGNOSIS — E1122 Type 2 diabetes mellitus with diabetic chronic kidney disease: Secondary | ICD-10-CM | POA: Diagnosis not present

## 2015-08-09 DIAGNOSIS — Z0000 Encounter for general adult medical examination without abnormal findings: Secondary | ICD-10-CM | POA: Diagnosis not present

## 2015-08-09 DIAGNOSIS — M859 Disorder of bone density and structure, unspecified: Secondary | ICD-10-CM | POA: Diagnosis not present

## 2015-08-09 DIAGNOSIS — K219 Gastro-esophageal reflux disease without esophagitis: Secondary | ICD-10-CM | POA: Diagnosis not present

## 2015-10-31 DIAGNOSIS — Z23 Encounter for immunization: Secondary | ICD-10-CM | POA: Diagnosis not present

## 2015-12-07 DIAGNOSIS — H401122 Primary open-angle glaucoma, left eye, moderate stage: Secondary | ICD-10-CM | POA: Diagnosis not present

## 2015-12-07 DIAGNOSIS — E119 Type 2 diabetes mellitus without complications: Secondary | ICD-10-CM | POA: Diagnosis not present

## 2015-12-07 DIAGNOSIS — H43813 Vitreous degeneration, bilateral: Secondary | ICD-10-CM | POA: Diagnosis not present

## 2015-12-07 DIAGNOSIS — H401112 Primary open-angle glaucoma, right eye, moderate stage: Secondary | ICD-10-CM | POA: Diagnosis not present

## 2016-02-11 DIAGNOSIS — E782 Mixed hyperlipidemia: Secondary | ICD-10-CM | POA: Diagnosis not present

## 2016-02-11 DIAGNOSIS — E1122 Type 2 diabetes mellitus with diabetic chronic kidney disease: Secondary | ICD-10-CM | POA: Diagnosis not present

## 2016-02-11 DIAGNOSIS — N183 Chronic kidney disease, stage 3 (moderate): Secondary | ICD-10-CM | POA: Diagnosis not present

## 2016-05-02 DIAGNOSIS — E119 Type 2 diabetes mellitus without complications: Secondary | ICD-10-CM | POA: Diagnosis not present

## 2016-05-02 DIAGNOSIS — H401132 Primary open-angle glaucoma, bilateral, moderate stage: Secondary | ICD-10-CM | POA: Diagnosis not present

## 2016-05-02 DIAGNOSIS — Z961 Presence of intraocular lens: Secondary | ICD-10-CM | POA: Diagnosis not present

## 2016-06-06 DIAGNOSIS — E1122 Type 2 diabetes mellitus with diabetic chronic kidney disease: Secondary | ICD-10-CM | POA: Diagnosis not present

## 2016-06-06 DIAGNOSIS — M25569 Pain in unspecified knee: Secondary | ICD-10-CM | POA: Diagnosis not present

## 2016-06-10 DIAGNOSIS — M25462 Effusion, left knee: Secondary | ICD-10-CM | POA: Diagnosis not present

## 2016-06-10 DIAGNOSIS — M25562 Pain in left knee: Secondary | ICD-10-CM | POA: Diagnosis not present

## 2016-06-10 DIAGNOSIS — M1712 Unilateral primary osteoarthritis, left knee: Secondary | ICD-10-CM | POA: Diagnosis not present

## 2016-06-11 DIAGNOSIS — M25462 Effusion, left knee: Secondary | ICD-10-CM | POA: Diagnosis not present

## 2016-06-11 DIAGNOSIS — M1712 Unilateral primary osteoarthritis, left knee: Secondary | ICD-10-CM | POA: Diagnosis not present

## 2016-06-11 DIAGNOSIS — M25562 Pain in left knee: Secondary | ICD-10-CM | POA: Diagnosis not present

## 2016-07-15 DIAGNOSIS — D692 Other nonthrombocytopenic purpura: Secondary | ICD-10-CM | POA: Diagnosis not present

## 2016-07-15 DIAGNOSIS — L570 Actinic keratosis: Secondary | ICD-10-CM | POA: Diagnosis not present

## 2016-07-15 DIAGNOSIS — D1801 Hemangioma of skin and subcutaneous tissue: Secondary | ICD-10-CM | POA: Diagnosis not present

## 2016-07-15 DIAGNOSIS — L821 Other seborrheic keratosis: Secondary | ICD-10-CM | POA: Diagnosis not present

## 2016-07-15 DIAGNOSIS — D225 Melanocytic nevi of trunk: Secondary | ICD-10-CM | POA: Diagnosis not present

## 2016-07-15 DIAGNOSIS — L814 Other melanin hyperpigmentation: Secondary | ICD-10-CM | POA: Diagnosis not present

## 2016-08-07 DIAGNOSIS — H01001 Unspecified blepharitis right upper eyelid: Secondary | ICD-10-CM | POA: Diagnosis not present

## 2016-08-07 DIAGNOSIS — Z961 Presence of intraocular lens: Secondary | ICD-10-CM | POA: Diagnosis not present

## 2016-08-07 DIAGNOSIS — H02403 Unspecified ptosis of bilateral eyelids: Secondary | ICD-10-CM | POA: Diagnosis not present

## 2016-08-07 DIAGNOSIS — H401132 Primary open-angle glaucoma, bilateral, moderate stage: Secondary | ICD-10-CM | POA: Diagnosis not present

## 2016-08-22 DIAGNOSIS — Z23 Encounter for immunization: Secondary | ICD-10-CM | POA: Diagnosis not present

## 2016-08-22 DIAGNOSIS — E1122 Type 2 diabetes mellitus with diabetic chronic kidney disease: Secondary | ICD-10-CM | POA: Diagnosis not present

## 2016-08-22 DIAGNOSIS — K219 Gastro-esophageal reflux disease without esophagitis: Secondary | ICD-10-CM | POA: Diagnosis not present

## 2016-08-22 DIAGNOSIS — N183 Chronic kidney disease, stage 3 (moderate): Secondary | ICD-10-CM | POA: Diagnosis not present

## 2016-08-22 DIAGNOSIS — Z125 Encounter for screening for malignant neoplasm of prostate: Secondary | ICD-10-CM | POA: Diagnosis not present

## 2016-08-22 DIAGNOSIS — Z0000 Encounter for general adult medical examination without abnormal findings: Secondary | ICD-10-CM | POA: Diagnosis not present

## 2016-08-22 DIAGNOSIS — M353 Polymyalgia rheumatica: Secondary | ICD-10-CM | POA: Diagnosis not present

## 2016-08-22 DIAGNOSIS — E782 Mixed hyperlipidemia: Secondary | ICD-10-CM | POA: Diagnosis not present

## 2016-08-22 DIAGNOSIS — J309 Allergic rhinitis, unspecified: Secondary | ICD-10-CM | POA: Diagnosis not present

## 2016-08-22 DIAGNOSIS — M859 Disorder of bone density and structure, unspecified: Secondary | ICD-10-CM | POA: Diagnosis not present

## 2016-10-31 DIAGNOSIS — B349 Viral infection, unspecified: Secondary | ICD-10-CM | POA: Diagnosis not present

## 2016-11-12 DIAGNOSIS — Z23 Encounter for immunization: Secondary | ICD-10-CM | POA: Diagnosis not present

## 2016-12-04 DIAGNOSIS — H401132 Primary open-angle glaucoma, bilateral, moderate stage: Secondary | ICD-10-CM | POA: Diagnosis not present

## 2016-12-18 DIAGNOSIS — H5340 Unspecified visual field defects: Secondary | ICD-10-CM | POA: Diagnosis not present

## 2016-12-18 DIAGNOSIS — H04123 Dry eye syndrome of bilateral lacrimal glands: Secondary | ICD-10-CM | POA: Diagnosis not present

## 2016-12-18 DIAGNOSIS — E119 Type 2 diabetes mellitus without complications: Secondary | ICD-10-CM | POA: Diagnosis not present

## 2016-12-18 DIAGNOSIS — H401132 Primary open-angle glaucoma, bilateral, moderate stage: Secondary | ICD-10-CM | POA: Diagnosis not present

## 2017-04-24 DIAGNOSIS — H401132 Primary open-angle glaucoma, bilateral, moderate stage: Secondary | ICD-10-CM | POA: Diagnosis not present

## 2017-04-24 DIAGNOSIS — Z961 Presence of intraocular lens: Secondary | ICD-10-CM | POA: Diagnosis not present

## 2017-04-24 DIAGNOSIS — H04123 Dry eye syndrome of bilateral lacrimal glands: Secondary | ICD-10-CM | POA: Diagnosis not present

## 2017-07-29 DIAGNOSIS — L570 Actinic keratosis: Secondary | ICD-10-CM | POA: Diagnosis not present

## 2017-07-29 DIAGNOSIS — Z85828 Personal history of other malignant neoplasm of skin: Secondary | ICD-10-CM | POA: Diagnosis not present

## 2017-07-29 DIAGNOSIS — L821 Other seborrheic keratosis: Secondary | ICD-10-CM | POA: Diagnosis not present

## 2017-07-29 DIAGNOSIS — L300 Nummular dermatitis: Secondary | ICD-10-CM | POA: Diagnosis not present

## 2017-07-29 DIAGNOSIS — L814 Other melanin hyperpigmentation: Secondary | ICD-10-CM | POA: Diagnosis not present

## 2017-07-29 DIAGNOSIS — D225 Melanocytic nevi of trunk: Secondary | ICD-10-CM | POA: Diagnosis not present

## 2017-07-29 DIAGNOSIS — D1801 Hemangioma of skin and subcutaneous tissue: Secondary | ICD-10-CM | POA: Diagnosis not present

## 2017-09-14 DIAGNOSIS — E1122 Type 2 diabetes mellitus with diabetic chronic kidney disease: Secondary | ICD-10-CM | POA: Diagnosis not present

## 2017-09-14 DIAGNOSIS — Z1389 Encounter for screening for other disorder: Secondary | ICD-10-CM | POA: Diagnosis not present

## 2017-09-14 DIAGNOSIS — J309 Allergic rhinitis, unspecified: Secondary | ICD-10-CM | POA: Diagnosis not present

## 2017-09-14 DIAGNOSIS — K219 Gastro-esophageal reflux disease without esophagitis: Secondary | ICD-10-CM | POA: Diagnosis not present

## 2017-09-14 DIAGNOSIS — M859 Disorder of bone density and structure, unspecified: Secondary | ICD-10-CM | POA: Diagnosis not present

## 2017-09-14 DIAGNOSIS — N183 Chronic kidney disease, stage 3 (moderate): Secondary | ICD-10-CM | POA: Diagnosis not present

## 2017-09-14 DIAGNOSIS — Z125 Encounter for screening for malignant neoplasm of prostate: Secondary | ICD-10-CM | POA: Diagnosis not present

## 2017-09-14 DIAGNOSIS — Z0000 Encounter for general adult medical examination without abnormal findings: Secondary | ICD-10-CM | POA: Diagnosis not present

## 2017-09-14 DIAGNOSIS — E782 Mixed hyperlipidemia: Secondary | ICD-10-CM | POA: Diagnosis not present

## 2017-09-14 DIAGNOSIS — Z8042 Family history of malignant neoplasm of prostate: Secondary | ICD-10-CM | POA: Diagnosis not present

## 2017-09-14 DIAGNOSIS — Z800 Family history of malignant neoplasm of digestive organs: Secondary | ICD-10-CM | POA: Diagnosis not present

## 2017-09-24 DIAGNOSIS — H401132 Primary open-angle glaucoma, bilateral, moderate stage: Secondary | ICD-10-CM | POA: Diagnosis not present

## 2017-09-24 DIAGNOSIS — H04123 Dry eye syndrome of bilateral lacrimal glands: Secondary | ICD-10-CM | POA: Diagnosis not present

## 2017-09-24 DIAGNOSIS — E119 Type 2 diabetes mellitus without complications: Secondary | ICD-10-CM | POA: Diagnosis not present

## 2017-09-24 DIAGNOSIS — H52203 Unspecified astigmatism, bilateral: Secondary | ICD-10-CM | POA: Diagnosis not present

## 2017-11-04 DIAGNOSIS — L570 Actinic keratosis: Secondary | ICD-10-CM | POA: Diagnosis not present

## 2018-01-04 DIAGNOSIS — Z23 Encounter for immunization: Secondary | ICD-10-CM | POA: Diagnosis not present

## 2018-01-04 DIAGNOSIS — L570 Actinic keratosis: Secondary | ICD-10-CM | POA: Diagnosis not present

## 2018-03-01 DIAGNOSIS — H04123 Dry eye syndrome of bilateral lacrimal glands: Secondary | ICD-10-CM | POA: Diagnosis not present

## 2018-03-01 DIAGNOSIS — H0012 Chalazion right lower eyelid: Secondary | ICD-10-CM | POA: Diagnosis not present

## 2018-03-01 DIAGNOSIS — H401132 Primary open-angle glaucoma, bilateral, moderate stage: Secondary | ICD-10-CM | POA: Diagnosis not present

## 2018-03-01 DIAGNOSIS — H0100A Unspecified blepharitis right eye, upper and lower eyelids: Secondary | ICD-10-CM | POA: Diagnosis not present

## 2018-03-17 DIAGNOSIS — E782 Mixed hyperlipidemia: Secondary | ICD-10-CM | POA: Diagnosis not present

## 2018-03-17 DIAGNOSIS — E1122 Type 2 diabetes mellitus with diabetic chronic kidney disease: Secondary | ICD-10-CM | POA: Diagnosis not present

## 2018-03-17 DIAGNOSIS — N183 Chronic kidney disease, stage 3 (moderate): Secondary | ICD-10-CM | POA: Diagnosis not present

## 2018-08-06 DIAGNOSIS — H353111 Nonexudative age-related macular degeneration, right eye, early dry stage: Secondary | ICD-10-CM | POA: Diagnosis not present

## 2018-08-06 DIAGNOSIS — H04123 Dry eye syndrome of bilateral lacrimal glands: Secondary | ICD-10-CM | POA: Diagnosis not present

## 2018-08-06 DIAGNOSIS — H401132 Primary open-angle glaucoma, bilateral, moderate stage: Secondary | ICD-10-CM | POA: Diagnosis not present

## 2018-08-06 DIAGNOSIS — H52203 Unspecified astigmatism, bilateral: Secondary | ICD-10-CM | POA: Diagnosis not present

## 2018-10-05 DIAGNOSIS — D225 Melanocytic nevi of trunk: Secondary | ICD-10-CM | POA: Diagnosis not present

## 2018-10-05 DIAGNOSIS — L820 Inflamed seborrheic keratosis: Secondary | ICD-10-CM | POA: Diagnosis not present

## 2018-10-05 DIAGNOSIS — C44622 Squamous cell carcinoma of skin of right upper limb, including shoulder: Secondary | ICD-10-CM | POA: Diagnosis not present

## 2018-10-05 DIAGNOSIS — Z85828 Personal history of other malignant neoplasm of skin: Secondary | ICD-10-CM | POA: Diagnosis not present

## 2018-10-05 DIAGNOSIS — L570 Actinic keratosis: Secondary | ICD-10-CM | POA: Diagnosis not present

## 2018-10-05 DIAGNOSIS — Z23 Encounter for immunization: Secondary | ICD-10-CM | POA: Diagnosis not present

## 2018-10-05 DIAGNOSIS — L814 Other melanin hyperpigmentation: Secondary | ICD-10-CM | POA: Diagnosis not present

## 2018-10-05 DIAGNOSIS — D485 Neoplasm of uncertain behavior of skin: Secondary | ICD-10-CM | POA: Diagnosis not present

## 2018-10-05 DIAGNOSIS — L821 Other seborrheic keratosis: Secondary | ICD-10-CM | POA: Diagnosis not present

## 2018-10-23 DIAGNOSIS — Z23 Encounter for immunization: Secondary | ICD-10-CM | POA: Diagnosis not present

## 2018-11-04 DIAGNOSIS — E1122 Type 2 diabetes mellitus with diabetic chronic kidney disease: Secondary | ICD-10-CM | POA: Diagnosis not present

## 2018-11-04 DIAGNOSIS — J309 Allergic rhinitis, unspecified: Secondary | ICD-10-CM | POA: Diagnosis not present

## 2018-11-04 DIAGNOSIS — E782 Mixed hyperlipidemia: Secondary | ICD-10-CM | POA: Diagnosis not present

## 2018-11-04 DIAGNOSIS — D692 Other nonthrombocytopenic purpura: Secondary | ICD-10-CM | POA: Diagnosis not present

## 2018-11-04 DIAGNOSIS — K219 Gastro-esophageal reflux disease without esophagitis: Secondary | ICD-10-CM | POA: Diagnosis not present

## 2018-11-04 DIAGNOSIS — M353 Polymyalgia rheumatica: Secondary | ICD-10-CM | POA: Diagnosis not present

## 2018-11-04 DIAGNOSIS — M859 Disorder of bone density and structure, unspecified: Secondary | ICD-10-CM | POA: Diagnosis not present

## 2018-11-04 DIAGNOSIS — Z0000 Encounter for general adult medical examination without abnormal findings: Secondary | ICD-10-CM | POA: Diagnosis not present

## 2018-11-04 DIAGNOSIS — Z1389 Encounter for screening for other disorder: Secondary | ICD-10-CM | POA: Diagnosis not present

## 2018-11-05 DIAGNOSIS — E1122 Type 2 diabetes mellitus with diabetic chronic kidney disease: Secondary | ICD-10-CM | POA: Diagnosis not present

## 2018-11-05 DIAGNOSIS — E782 Mixed hyperlipidemia: Secondary | ICD-10-CM | POA: Diagnosis not present

## 2018-11-18 DIAGNOSIS — D0461 Carcinoma in situ of skin of right upper limb, including shoulder: Secondary | ICD-10-CM | POA: Diagnosis not present

## 2018-11-18 DIAGNOSIS — C44622 Squamous cell carcinoma of skin of right upper limb, including shoulder: Secondary | ICD-10-CM | POA: Diagnosis not present

## 2019-01-25 DIAGNOSIS — M25461 Effusion, right knee: Secondary | ICD-10-CM | POA: Diagnosis not present

## 2019-01-25 DIAGNOSIS — M1711 Unilateral primary osteoarthritis, right knee: Secondary | ICD-10-CM | POA: Diagnosis not present

## 2019-01-25 DIAGNOSIS — M25561 Pain in right knee: Secondary | ICD-10-CM | POA: Diagnosis not present

## 2019-05-10 DIAGNOSIS — M1990 Unspecified osteoarthritis, unspecified site: Secondary | ICD-10-CM | POA: Diagnosis not present

## 2019-05-10 DIAGNOSIS — N183 Chronic kidney disease, stage 3 unspecified: Secondary | ICD-10-CM | POA: Diagnosis not present

## 2019-05-10 DIAGNOSIS — E1122 Type 2 diabetes mellitus with diabetic chronic kidney disease: Secondary | ICD-10-CM | POA: Diagnosis not present

## 2019-05-10 DIAGNOSIS — E782 Mixed hyperlipidemia: Secondary | ICD-10-CM | POA: Diagnosis not present

## 2019-10-12 DIAGNOSIS — D225 Melanocytic nevi of trunk: Secondary | ICD-10-CM | POA: Diagnosis not present

## 2019-10-12 DIAGNOSIS — L2084 Intrinsic (allergic) eczema: Secondary | ICD-10-CM | POA: Diagnosis not present

## 2019-10-12 DIAGNOSIS — L814 Other melanin hyperpigmentation: Secondary | ICD-10-CM | POA: Diagnosis not present

## 2019-10-12 DIAGNOSIS — D485 Neoplasm of uncertain behavior of skin: Secondary | ICD-10-CM | POA: Diagnosis not present

## 2019-10-12 DIAGNOSIS — L821 Other seborrheic keratosis: Secondary | ICD-10-CM | POA: Diagnosis not present

## 2019-10-12 DIAGNOSIS — D0439 Carcinoma in situ of skin of other parts of face: Secondary | ICD-10-CM | POA: Diagnosis not present

## 2019-10-12 DIAGNOSIS — Z85828 Personal history of other malignant neoplasm of skin: Secondary | ICD-10-CM | POA: Diagnosis not present

## 2019-10-12 DIAGNOSIS — L570 Actinic keratosis: Secondary | ICD-10-CM | POA: Diagnosis not present

## 2019-10-12 DIAGNOSIS — L578 Other skin changes due to chronic exposure to nonionizing radiation: Secondary | ICD-10-CM | POA: Diagnosis not present

## 2019-11-07 DIAGNOSIS — Z1389 Encounter for screening for other disorder: Secondary | ICD-10-CM | POA: Diagnosis not present

## 2019-11-07 DIAGNOSIS — M859 Disorder of bone density and structure, unspecified: Secondary | ICD-10-CM | POA: Diagnosis not present

## 2019-11-07 DIAGNOSIS — Z0000 Encounter for general adult medical examination without abnormal findings: Secondary | ICD-10-CM | POA: Diagnosis not present

## 2019-11-07 DIAGNOSIS — Z125 Encounter for screening for malignant neoplasm of prostate: Secondary | ICD-10-CM | POA: Diagnosis not present

## 2019-11-07 DIAGNOSIS — K219 Gastro-esophageal reflux disease without esophagitis: Secondary | ICD-10-CM | POA: Diagnosis not present

## 2019-11-07 DIAGNOSIS — E782 Mixed hyperlipidemia: Secondary | ICD-10-CM | POA: Diagnosis not present

## 2019-11-07 DIAGNOSIS — E1122 Type 2 diabetes mellitus with diabetic chronic kidney disease: Secondary | ICD-10-CM | POA: Diagnosis not present

## 2019-11-07 DIAGNOSIS — N183 Chronic kidney disease, stage 3 unspecified: Secondary | ICD-10-CM | POA: Diagnosis not present

## 2019-11-07 DIAGNOSIS — Z23 Encounter for immunization: Secondary | ICD-10-CM | POA: Diagnosis not present

## 2019-11-24 DIAGNOSIS — D0439 Carcinoma in situ of skin of other parts of face: Secondary | ICD-10-CM | POA: Diagnosis not present

## 2019-12-21 DIAGNOSIS — Z1159 Encounter for screening for other viral diseases: Secondary | ICD-10-CM | POA: Diagnosis not present

## 2019-12-21 DIAGNOSIS — Z800 Family history of malignant neoplasm of digestive organs: Secondary | ICD-10-CM | POA: Diagnosis not present

## 2019-12-21 DIAGNOSIS — K219 Gastro-esophageal reflux disease without esophagitis: Secondary | ICD-10-CM | POA: Diagnosis not present

## 2019-12-21 DIAGNOSIS — Z86010 Personal history of colonic polyps: Secondary | ICD-10-CM | POA: Diagnosis not present

## 2020-01-11 DIAGNOSIS — H524 Presbyopia: Secondary | ICD-10-CM | POA: Diagnosis not present

## 2020-01-11 DIAGNOSIS — H04123 Dry eye syndrome of bilateral lacrimal glands: Secondary | ICD-10-CM | POA: Diagnosis not present

## 2020-01-11 DIAGNOSIS — E119 Type 2 diabetes mellitus without complications: Secondary | ICD-10-CM | POA: Diagnosis not present

## 2020-01-11 DIAGNOSIS — H401132 Primary open-angle glaucoma, bilateral, moderate stage: Secondary | ICD-10-CM | POA: Diagnosis not present

## 2020-01-30 DIAGNOSIS — Z01812 Encounter for preprocedural laboratory examination: Secondary | ICD-10-CM | POA: Diagnosis not present

## 2020-02-02 DIAGNOSIS — K5730 Diverticulosis of large intestine without perforation or abscess without bleeding: Secondary | ICD-10-CM | POA: Diagnosis not present

## 2020-02-02 DIAGNOSIS — Z86010 Personal history of colonic polyps: Secondary | ICD-10-CM | POA: Diagnosis not present

## 2020-02-02 DIAGNOSIS — K644 Residual hemorrhoidal skin tags: Secondary | ICD-10-CM | POA: Diagnosis not present

## 2020-02-02 DIAGNOSIS — Q438 Other specified congenital malformations of intestine: Secondary | ICD-10-CM | POA: Diagnosis not present

## 2020-02-02 DIAGNOSIS — D123 Benign neoplasm of transverse colon: Secondary | ICD-10-CM | POA: Diagnosis not present

## 2020-02-02 DIAGNOSIS — K621 Rectal polyp: Secondary | ICD-10-CM | POA: Diagnosis not present

## 2020-02-02 DIAGNOSIS — D120 Benign neoplasm of cecum: Secondary | ICD-10-CM | POA: Diagnosis not present

## 2020-02-02 DIAGNOSIS — Z800 Family history of malignant neoplasm of digestive organs: Secondary | ICD-10-CM | POA: Diagnosis not present

## 2020-02-07 DIAGNOSIS — D123 Benign neoplasm of transverse colon: Secondary | ICD-10-CM | POA: Diagnosis not present

## 2020-02-07 DIAGNOSIS — K621 Rectal polyp: Secondary | ICD-10-CM | POA: Diagnosis not present

## 2020-02-07 DIAGNOSIS — D120 Benign neoplasm of cecum: Secondary | ICD-10-CM | POA: Diagnosis not present

## 2020-05-07 DIAGNOSIS — C4490 Unspecified malignant neoplasm of skin, unspecified: Secondary | ICD-10-CM | POA: Diagnosis not present

## 2020-05-07 DIAGNOSIS — N183 Chronic kidney disease, stage 3 unspecified: Secondary | ICD-10-CM | POA: Diagnosis not present

## 2020-05-07 DIAGNOSIS — E1122 Type 2 diabetes mellitus with diabetic chronic kidney disease: Secondary | ICD-10-CM | POA: Diagnosis not present

## 2020-05-07 DIAGNOSIS — J309 Allergic rhinitis, unspecified: Secondary | ICD-10-CM | POA: Diagnosis not present

## 2020-05-07 DIAGNOSIS — Z86010 Personal history of colonic polyps: Secondary | ICD-10-CM | POA: Diagnosis not present

## 2020-05-07 DIAGNOSIS — E782 Mixed hyperlipidemia: Secondary | ICD-10-CM | POA: Diagnosis not present

## 2020-07-19 DIAGNOSIS — H401132 Primary open-angle glaucoma, bilateral, moderate stage: Secondary | ICD-10-CM | POA: Diagnosis not present

## 2020-07-19 DIAGNOSIS — Z961 Presence of intraocular lens: Secondary | ICD-10-CM | POA: Diagnosis not present

## 2020-08-06 ENCOUNTER — Other Ambulatory Visit: Payer: Self-pay | Admitting: Family Medicine

## 2020-08-06 ENCOUNTER — Ambulatory Visit
Admission: RE | Admit: 2020-08-06 | Discharge: 2020-08-06 | Disposition: A | Discharge status: Home / Self Care | Payer: Medicare PPO | Source: Ambulatory Visit | Attending: Family Medicine | Admitting: Family Medicine

## 2020-08-06 DIAGNOSIS — R55 Syncope and collapse: Secondary | ICD-10-CM

## 2020-08-06 DIAGNOSIS — I700 Atherosclerosis of aorta: Secondary | ICD-10-CM | POA: Diagnosis not present

## 2020-08-08 DIAGNOSIS — R55 Syncope and collapse: Secondary | ICD-10-CM | POA: Diagnosis not present

## 2020-08-08 DIAGNOSIS — I6523 Occlusion and stenosis of bilateral carotid arteries: Secondary | ICD-10-CM | POA: Diagnosis not present

## 2020-10-15 DIAGNOSIS — Z85828 Personal history of other malignant neoplasm of skin: Secondary | ICD-10-CM | POA: Diagnosis not present

## 2020-10-15 DIAGNOSIS — D225 Melanocytic nevi of trunk: Secondary | ICD-10-CM | POA: Diagnosis not present

## 2020-10-15 DIAGNOSIS — L578 Other skin changes due to chronic exposure to nonionizing radiation: Secondary | ICD-10-CM | POA: Diagnosis not present

## 2020-10-15 DIAGNOSIS — L300 Nummular dermatitis: Secondary | ICD-10-CM | POA: Diagnosis not present

## 2020-10-15 DIAGNOSIS — L814 Other melanin hyperpigmentation: Secondary | ICD-10-CM | POA: Diagnosis not present

## 2020-10-15 DIAGNOSIS — L570 Actinic keratosis: Secondary | ICD-10-CM | POA: Diagnosis not present

## 2020-10-15 DIAGNOSIS — L821 Other seborrheic keratosis: Secondary | ICD-10-CM | POA: Diagnosis not present

## 2020-11-13 DIAGNOSIS — Z0000 Encounter for general adult medical examination without abnormal findings: Secondary | ICD-10-CM | POA: Diagnosis not present

## 2020-11-13 DIAGNOSIS — M353 Polymyalgia rheumatica: Secondary | ICD-10-CM | POA: Diagnosis not present

## 2020-11-13 DIAGNOSIS — N183 Chronic kidney disease, stage 3 unspecified: Secondary | ICD-10-CM | POA: Diagnosis not present

## 2020-11-13 DIAGNOSIS — M859 Disorder of bone density and structure, unspecified: Secondary | ICD-10-CM | POA: Diagnosis not present

## 2020-11-13 DIAGNOSIS — K219 Gastro-esophageal reflux disease without esophagitis: Secondary | ICD-10-CM | POA: Diagnosis not present

## 2020-11-13 DIAGNOSIS — E1122 Type 2 diabetes mellitus with diabetic chronic kidney disease: Secondary | ICD-10-CM | POA: Diagnosis not present

## 2020-11-13 DIAGNOSIS — Z1389 Encounter for screening for other disorder: Secondary | ICD-10-CM | POA: Diagnosis not present

## 2020-11-13 DIAGNOSIS — E782 Mixed hyperlipidemia: Secondary | ICD-10-CM | POA: Diagnosis not present

## 2020-11-13 DIAGNOSIS — S41119A Laceration without foreign body of unspecified upper arm, initial encounter: Secondary | ICD-10-CM | POA: Diagnosis not present

## 2021-01-03 DIAGNOSIS — H401132 Primary open-angle glaucoma, bilateral, moderate stage: Secondary | ICD-10-CM | POA: Diagnosis not present

## 2021-05-15 DIAGNOSIS — R030 Elevated blood-pressure reading, without diagnosis of hypertension: Secondary | ICD-10-CM | POA: Diagnosis not present

## 2021-05-15 DIAGNOSIS — E782 Mixed hyperlipidemia: Secondary | ICD-10-CM | POA: Diagnosis not present

## 2021-05-15 DIAGNOSIS — E1122 Type 2 diabetes mellitus with diabetic chronic kidney disease: Secondary | ICD-10-CM | POA: Diagnosis not present

## 2021-05-15 DIAGNOSIS — J309 Allergic rhinitis, unspecified: Secondary | ICD-10-CM | POA: Diagnosis not present

## 2021-05-15 DIAGNOSIS — N183 Chronic kidney disease, stage 3 unspecified: Secondary | ICD-10-CM | POA: Diagnosis not present

## 2021-07-08 DIAGNOSIS — Z961 Presence of intraocular lens: Secondary | ICD-10-CM | POA: Diagnosis not present

## 2021-07-08 DIAGNOSIS — E119 Type 2 diabetes mellitus without complications: Secondary | ICD-10-CM | POA: Diagnosis not present

## 2021-07-08 DIAGNOSIS — H5212 Myopia, left eye: Secondary | ICD-10-CM | POA: Diagnosis not present

## 2021-07-08 DIAGNOSIS — H401132 Primary open-angle glaucoma, bilateral, moderate stage: Secondary | ICD-10-CM | POA: Diagnosis not present

## 2021-08-08 ENCOUNTER — Other Ambulatory Visit: Payer: Self-pay

## 2021-08-08 ENCOUNTER — Encounter (HOSPITAL_COMMUNITY): Payer: Self-pay | Admitting: Emergency Medicine

## 2021-08-08 ENCOUNTER — Emergency Department (HOSPITAL_COMMUNITY): Payer: Medicare PPO

## 2021-08-08 ENCOUNTER — Inpatient Hospital Stay (HOSPITAL_COMMUNITY)
Admission: EM | Admit: 2021-08-08 | Discharge: 2021-08-13 | DRG: 244 | Disposition: A | Discharge status: Home / Self Care | Payer: Medicare PPO | Attending: Cardiology | Admitting: Cardiology

## 2021-08-08 DIAGNOSIS — I495 Sick sinus syndrome: Secondary | ICD-10-CM | POA: Diagnosis not present

## 2021-08-08 DIAGNOSIS — I4891 Unspecified atrial fibrillation: Secondary | ICD-10-CM | POA: Diagnosis present

## 2021-08-08 DIAGNOSIS — R0602 Shortness of breath: Secondary | ICD-10-CM | POA: Diagnosis not present

## 2021-08-08 DIAGNOSIS — I10 Essential (primary) hypertension: Secondary | ICD-10-CM | POA: Diagnosis present

## 2021-08-08 DIAGNOSIS — J90 Pleural effusion, not elsewhere classified: Secondary | ICD-10-CM | POA: Diagnosis not present

## 2021-08-08 DIAGNOSIS — H409 Unspecified glaucoma: Secondary | ICD-10-CM | POA: Diagnosis present

## 2021-08-08 DIAGNOSIS — I959 Hypotension, unspecified: Secondary | ICD-10-CM | POA: Diagnosis not present

## 2021-08-08 DIAGNOSIS — E119 Type 2 diabetes mellitus without complications: Secondary | ICD-10-CM | POA: Diagnosis present

## 2021-08-08 DIAGNOSIS — J439 Emphysema, unspecified: Secondary | ICD-10-CM | POA: Diagnosis not present

## 2021-08-08 DIAGNOSIS — R918 Other nonspecific abnormal finding of lung field: Secondary | ICD-10-CM | POA: Diagnosis not present

## 2021-08-08 DIAGNOSIS — E785 Hyperlipidemia, unspecified: Secondary | ICD-10-CM | POA: Diagnosis present

## 2021-08-08 DIAGNOSIS — R079 Chest pain, unspecified: Secondary | ICD-10-CM | POA: Diagnosis not present

## 2021-08-08 DIAGNOSIS — M47814 Spondylosis without myelopathy or radiculopathy, thoracic region: Secondary | ICD-10-CM | POA: Diagnosis not present

## 2021-08-08 DIAGNOSIS — Z87891 Personal history of nicotine dependence: Secondary | ICD-10-CM | POA: Diagnosis not present

## 2021-08-08 DIAGNOSIS — I700 Atherosclerosis of aorta: Secondary | ICD-10-CM | POA: Diagnosis not present

## 2021-08-08 DIAGNOSIS — Z8249 Family history of ischemic heart disease and other diseases of the circulatory system: Secondary | ICD-10-CM | POA: Diagnosis not present

## 2021-08-08 DIAGNOSIS — I480 Paroxysmal atrial fibrillation: Secondary | ICD-10-CM | POA: Diagnosis present

## 2021-08-08 HISTORY — DX: Unspecified glaucoma: H40.9

## 2021-08-08 HISTORY — DX: Essential (primary) hypertension: I10

## 2021-08-08 HISTORY — DX: Hyperlipidemia, unspecified: E78.5

## 2021-08-08 LAB — CBC
HCT: 45.9 % (ref 39.0–52.0)
Hemoglobin: 16.2 g/dL (ref 13.0–17.0)
MCH: 31.8 pg (ref 26.0–34.0)
MCHC: 35.3 g/dL (ref 30.0–36.0)
MCV: 90 fL (ref 80.0–100.0)
Platelets: 194 10*3/uL (ref 150–400)
RBC: 5.1 MIL/uL (ref 4.22–5.81)
RDW: 13.2 % (ref 11.5–15.5)
WBC: 7 10*3/uL (ref 4.0–10.5)
nRBC: 0 % (ref 0.0–0.2)

## 2021-08-08 LAB — BASIC METABOLIC PANEL
Anion gap: 8 (ref 5–15)
BUN: 14 mg/dL (ref 8–23)
CO2: 28 mmol/L (ref 22–32)
Calcium: 9.9 mg/dL (ref 8.9–10.3)
Chloride: 96 mmol/L — ABNORMAL LOW (ref 98–111)
Creatinine, Ser: 1.28 mg/dL — ABNORMAL HIGH (ref 0.61–1.24)
GFR, Estimated: 57 mL/min — ABNORMAL LOW (ref >60)
Glucose, Bld: 162 mg/dL — ABNORMAL HIGH (ref 70–99)
Potassium: 4.5 mmol/L (ref 3.5–5.1)
Sodium: 132 mmol/L — ABNORMAL LOW (ref 135–145)

## 2021-08-08 LAB — TROPONIN I (HIGH SENSITIVITY): Troponin I (High Sensitivity): 4 ng/L (ref <18)

## 2021-08-08 MED ORDER — ASPIRIN 81 MG PO CHEW
324.0000 mg | CHEWABLE_TABLET | Freq: Once | ORAL | Status: AC
  Administered 2021-08-08: 324 mg via ORAL
  Filled 2021-08-08: qty 4

## 2021-08-08 NOTE — ED Provider Triage Note (Signed)
  Emergency Medicine Provider Triage Evaluation Note  MRN:  [id number]  Arrival date & time: 08/08/21    Medically screening exam initiated at 10:35 PM.   CC:   Chest Pain   HPI:  William Daniels is a 81 y.o. year-old male presents to the ED with chief complaint of chest pain that radiates up to his neck.  Onset was this afternoon.  Pain has been constant.  Reports mild associated shortness of breath.  Has history of diabetes and hypertension, which are well controlled.  Denies new cough or fever.  History provided by patient. ROS:  -As included in HPI PE:   Vitals:   08/08/21 2227  BP: (!) 155/92  Pulse: (!) 106  Resp: 16  Temp: 97.9 F (36.6 C)  SpO2: 99%    Non-toxic appearing No respiratory distress No m/m/g, no w/r/r MDM:  Based on signs and symptoms, ACS is highest on my differential, followed by URI. I've ordered labs in triage to expedite lab/diagnostic workup.  Patient was informed that the remainder of the evaluation will be completed by another provider, this initial triage assessment does not replace that evaluation, and the importance of remaining in the ED until their evaluation is complete.    Montine Circle, PA-C 08/08/21 2236

## 2021-08-08 NOTE — ED Triage Notes (Signed)
Pt reported to ED with c/o central chest pain that radiates upwards towards neck. Pt endorses some shortness of breath.

## 2021-08-09 ENCOUNTER — Inpatient Hospital Stay (HOSPITAL_COMMUNITY): Payer: Medicare PPO

## 2021-08-09 ENCOUNTER — Emergency Department (HOSPITAL_COMMUNITY): Payer: Medicare PPO

## 2021-08-09 ENCOUNTER — Encounter (HOSPITAL_COMMUNITY): Payer: Self-pay | Admitting: Cardiology

## 2021-08-09 ENCOUNTER — Telehealth (HOSPITAL_COMMUNITY): Payer: Self-pay | Admitting: Pharmacy Technician

## 2021-08-09 ENCOUNTER — Other Ambulatory Visit (HOSPITAL_COMMUNITY): Payer: Self-pay

## 2021-08-09 ENCOUNTER — Other Ambulatory Visit (HOSPITAL_COMMUNITY): Payer: Medicare PPO

## 2021-08-09 DIAGNOSIS — I959 Hypotension, unspecified: Secondary | ICD-10-CM | POA: Diagnosis not present

## 2021-08-09 DIAGNOSIS — I10 Essential (primary) hypertension: Secondary | ICD-10-CM

## 2021-08-09 DIAGNOSIS — I480 Paroxysmal atrial fibrillation: Secondary | ICD-10-CM | POA: Diagnosis present

## 2021-08-09 DIAGNOSIS — I4891 Unspecified atrial fibrillation: Secondary | ICD-10-CM | POA: Diagnosis present

## 2021-08-09 DIAGNOSIS — E785 Hyperlipidemia, unspecified: Secondary | ICD-10-CM | POA: Diagnosis present

## 2021-08-09 DIAGNOSIS — Z87891 Personal history of nicotine dependence: Secondary | ICD-10-CM | POA: Diagnosis not present

## 2021-08-09 DIAGNOSIS — H409 Unspecified glaucoma: Secondary | ICD-10-CM | POA: Diagnosis present

## 2021-08-09 DIAGNOSIS — E119 Type 2 diabetes mellitus without complications: Secondary | ICD-10-CM | POA: Diagnosis present

## 2021-08-09 DIAGNOSIS — I495 Sick sinus syndrome: Secondary | ICD-10-CM | POA: Diagnosis present

## 2021-08-09 DIAGNOSIS — Z8249 Family history of ischemic heart disease and other diseases of the circulatory system: Secondary | ICD-10-CM | POA: Diagnosis not present

## 2021-08-09 LAB — ECHOCARDIOGRAM COMPLETE
S' Lateral: 2.2 cm
Weight: 2528 oz

## 2021-08-09 LAB — MAGNESIUM: Magnesium: 2.2 mg/dL (ref 1.7–2.4)

## 2021-08-09 LAB — HEMOGLOBIN A1C
Hgb A1c MFr Bld: 5.8 % — ABNORMAL HIGH (ref 4.8–5.6)
Mean Plasma Glucose: 119.76 mg/dL

## 2021-08-09 LAB — TROPONIN I (HIGH SENSITIVITY): Troponin I (High Sensitivity): 4 ng/L (ref <18)

## 2021-08-09 LAB — BRAIN NATRIURETIC PEPTIDE: B Natriuretic Peptide: 24.8 pg/mL (ref 0.0–100.0)

## 2021-08-09 MED ORDER — LATANOPROST 0.005 % OP SOLN
1.0000 [drp] | Freq: Every evening | OPHTHALMIC | Status: DC
  Administered 2021-08-10 – 2021-08-12 (×3): 1 [drp] via OPHTHALMIC
  Filled 2021-08-09: qty 2.5

## 2021-08-09 MED ORDER — ONDANSETRON HCL 4 MG/2ML IJ SOLN: 4.0000 mg | Freq: Four times a day (QID) | INTRAMUSCULAR | Status: DC | PRN

## 2021-08-09 MED ORDER — FAMOTIDINE 20 MG PO TABS
20.0000 mg | ORAL_TABLET | Freq: Every day | ORAL | Status: DC
  Administered 2021-08-09 – 2021-08-13 (×5): 20 mg via ORAL
  Filled 2021-08-09 (×5): qty 1

## 2021-08-09 MED ORDER — IOHEXOL 350 MG/ML SOLN
50.0000 mL | Freq: Once | INTRAVENOUS | Status: AC | PRN
  Administered 2021-08-09: 50 mL via INTRAVENOUS

## 2021-08-09 MED ORDER — METOPROLOL TARTRATE 25 MG PO TABS
25.0000 mg | ORAL_TABLET | Freq: Two times a day (BID) | ORAL | Status: DC
  Administered 2021-08-09: 25 mg via ORAL
  Filled 2021-08-09: qty 1

## 2021-08-09 MED ORDER — DILTIAZEM HCL 25 MG/5ML IV SOLN
15.0000 mg | Freq: Once | INTRAVENOUS | Status: AC
  Administered 2021-08-09: 15 mg via INTRAVENOUS
  Filled 2021-08-09: qty 5

## 2021-08-09 MED ORDER — APIXABAN 5 MG PO TABS
5.0000 mg | ORAL_TABLET | Freq: Two times a day (BID) | ORAL | 11 refills | Status: DC
  Filled 2021-08-09: qty 60, 30d supply, fill #0

## 2021-08-09 MED ORDER — APIXABAN 5 MG PO TABS: 5.0000 mg | ORAL_TABLET | Freq: Two times a day (BID) | ORAL | Status: DC

## 2021-08-09 MED ORDER — ACETAMINOPHEN 500 MG PO TABS
1000.0000 mg | ORAL_TABLET | Freq: Once | ORAL | Status: AC
  Administered 2021-08-09: 1000 mg via ORAL
  Filled 2021-08-09: qty 2

## 2021-08-09 MED ORDER — LACTATED RINGERS IV BOLUS
250.0000 mL | Freq: Once | INTRAVENOUS | Status: AC
  Administered 2021-08-09: 250 mL via INTRAVENOUS

## 2021-08-09 MED ORDER — ACETAMINOPHEN 325 MG PO TABS: 650.0000 mg | ORAL_TABLET | ORAL | Status: DC | PRN

## 2021-08-09 MED ORDER — SIMVASTATIN 20 MG PO TABS
40.0000 mg | ORAL_TABLET | Freq: Every day | ORAL | Status: DC
Start: 2021-08-09 — End: 2021-08-13
  Administered 2021-08-09 – 2021-08-12 (×4): 40 mg via ORAL
  Filled 2021-08-09 (×4): qty 2

## 2021-08-09 MED ORDER — APIXABAN 5 MG PO TABS
5.0000 mg | ORAL_TABLET | Freq: Two times a day (BID) | ORAL | Status: DC
Start: 2021-08-09 — End: 2021-08-10
  Administered 2021-08-09 – 2021-08-10 (×3): 5 mg via ORAL
  Filled 2021-08-09 (×3): qty 1

## 2021-08-09 MED ORDER — FENTANYL CITRATE PF 50 MCG/ML IJ SOSY
50.0000 ug | PREFILLED_SYRINGE | Freq: Once | INTRAMUSCULAR | Status: AC
  Administered 2021-08-09: 50 ug via INTRAVENOUS
  Filled 2021-08-09: qty 1

## 2021-08-09 MED ORDER — LACTATED RINGERS IV BOLUS
1000.0000 mL | Freq: Once | INTRAVENOUS | Status: AC
  Administered 2021-08-09: 1000 mL via INTRAVENOUS

## 2021-08-09 NOTE — ED Notes (Signed)
Pt's BP 74/56, verified by manual. Pt is alert and oriented x4 with no complaints at this time. Margaretann Loveless, MD made aware. Verbal order for 228m bolus of LR given.

## 2021-08-09 NOTE — ED Notes (Signed)
Admitting paged to RN per her request 

## 2021-08-09 NOTE — ED Notes (Signed)
Family called this RN into room stating that they needed help due to patient being unresponsive. This RN went into room to find that patient was alert and oriented and answered all questions appropriately. Upon reviewing the monitor patient was found to have 13 seconds of asystole. Provider Margaretann Loveless paged and will come to bedside to evaluate patient.

## 2021-08-09 NOTE — TOC Benefit Eligibility Note (Signed)
Patient Teacher, English as a foreign language completed.    The patient is currently admitted and upon discharge could be taking Eliquis 5 mg.  The current 30 day co-pay is, $75.00.   The patient is currently admitted and upon discharge could be taking Xarelto 20 mg.  The current 30 day co-pay is, $75.00.   The patient is insured through [place], [place] Patient Advocate Specialist [place] Patient Advocate Team Direct Number: [phone]  Fax: [phone]

## 2021-08-09 NOTE — Telephone Encounter (Signed)
Pharmacy Patient Advocate Encounter  Insurance verification completed.    The patient is insured through [employer] Part D   The patient is currently admitted and ran test claims for the following: Eliquis, Xarelto.  Copays and coinsurance results were relayed to Inpatient clinical team.      

## 2021-08-09 NOTE — Progress Notes (Signed)
I presented to the bedside for William Daniels urgently after he experienced a 13.2-second conversion pause from atrial fibrillation to sinus rhythm.  His wife and son were at the bedside and noted that he became unresponsive with shaking limbs and then returned to normal state of being.  He feels well now without chest pain or shortness of breath.  He is maintaining sinus rhythm.  I have promptly discontinued his metoprolol prescription which was started for rate control in atrial fibrillation.  Avoid additional AV nodal blocking agents at this time.  We will plan for EP consultation to discuss conduction system disease in the morning.  He should remain in hospital overnight on telemetry for monitoring of any additional rhythm disturbance.  William Munroe, MD 5:51 PM 08/09/21

## 2021-08-09 NOTE — ED Notes (Signed)
Provider at bedside at this time

## 2021-08-09 NOTE — Discharge Instructions (Signed)
Information on my medicine - ELIQUIS (apixaban)  Why was Eliquis prescribed for you? Eliquis was prescribed for you to reduce the risk of a blood clot forming that can cause a stroke if you have a medical condition called atrial fibrillation (a type of irregular heartbeat).  What do You need to know about Eliquis ? Take your Eliquis TWICE DAILY - one tablet in the morning and one tablet in the evening with or without food. If you have difficulty swallowing the tablet whole please discuss with your pharmacist how to take the medication safely.  Take Eliquis exactly as prescribed by your doctor and DO NOT stop taking Eliquis without talking to the doctor who prescribed the medication.  Stopping may increase your risk of developing a stroke.  Refill your prescription before you run out.  After discharge, you should have regular check-up appointments with your healthcare provider that is prescribing your Eliquis.  In the future your dose may need to be changed if your kidney function or weight changes by a significant amount or as you get older.  What do you do if you miss a dose? If you miss a dose, take it as soon as you remember on the same day and resume taking twice daily.  Do not take more than one dose of ELIQUIS at the same time to make up a missed dose.  Important Safety Information A possible side effect of Eliquis is bleeding. You should call your healthcare provider right away if you experience any of the following: Bleeding from an injury or your nose that does not stop. Unusual colored urine (red or dark brown) or unusual colored stools (red or black). Unusual bruising for unknown reasons. A serious fall or if you hit your head (even if there is no bleeding).  Some medicines may interact with Eliquis and might increase your risk of bleeding or clotting while on Eliquis. To help avoid this, consult your healthcare provider or pharmacist prior to using any new prescription or  non-prescription medications, including herbals, vitamins, non-steroidal anti-inflammatory drugs (NSAIDs) and supplements.  This website has more information on Eliquis (apixaban): http://www.eliquis.com/eliquis/home  After Your Pacemaker   You have a Chiropractor  ACTIVITY Do not lift your arm above shoulder height for 1 week after your procedure. After 7 days, you may progress as below.  You should remove your sling 24 hours after your procedure, unless otherwise instructed by your provider.     Tuesday August 20, 2021  Wednesday August 21, 2021 Thursday August 22, 2021 Friday August 23, 2021   Do not lift, push, pull, or carry anything over 10 pounds with the affected arm until 6 weeks (Tuesday September 24, 2021 ) after your procedure.   You may drive AFTER your wound check, unless you have been told otherwise by your provider.   Ask your healthcare provider when you can go back to work   INCISION/Dressing Start your eliquis on 08/18/2021 with the am dose.   If large square, outer bandage is left in place, this can be removed after 24 hours from your procedure. Do not remove steri-strips or glue as below.   Monitor your Pacemaker site for redness, swelling, and drainage. Call the device clinic at [phone] if you experience these symptoms or fever/chills.  If your incision is sealed with Steri-strips or staples, you may shower 7 days after your procedure or when told by your provider. Do not remove the steri-strips or let the shower hit directly on your  site. You may wash around your site with soap and water.    If you were discharged in a sling, please do not wear this during the day more than 48 hours after your surgery unless otherwise instructed. This may increase the risk of stiffness and soreness in your shoulder.   Avoid lotions, ointments, or perfumes over your incision until it is well-healed.  You may use a hot tub or a pool AFTER your wound check  appointment if the incision is completely closed.  Pacemaker Alerts:  Some alerts are vibratory and others beep. These are NOT emergencies. Please call our office to let us know. If this occurs at night or on weekends, it can wait until the next business day. Send a remote transmission.  If your device is capable of reading fluid status (for heart failure), you will be offered monthly monitoring to review this with you.   DEVICE MANAGEMENT Remote monitoring is used to monitor your pacemaker from home. This monitoring is scheduled every 91 days by our office. It allows [place] to keep an eye on the functioning of your device to ensure it is working properly. You will routinely see your Electrophysiologist annually (more often if necessary).   You should receive your ID card for your new device in 4-8 weeks. Keep this card with you at all times once received. Consider wearing a medical alert bracelet or necklace.  Your Pacemaker may be MRI compatible. This will be discussed at your next office visit/wound check.  You should avoid contact with strong electric or magnetic fields.   Do not use amateur (ham) radio equipment or electric (arc) welding torches. MP3 player headphones with magnets should not be used. Some devices are safe to use if held at least 12 inches (30 cm) from your Pacemaker. These include power tools, lawn mowers, and speakers. If you are unsure if something is safe to use, ask your health care provider.  When using your cell phone, hold it to the ear that is on the opposite side from the Pacemaker. Do not leave your cell phone in a pocket over the Pacemaker.  You may safely use electric blankets, heating pads, computers, and microwave ovens.  Call the office right away if: You have chest pain. You feel more short of breath than you have felt before. You feel more light-headed than you have felt before. Your incision starts to open up.  This information is not intended to replace  advice given to you by your health care provider. Make sure you discuss any questions you have with your health care provider.

## 2021-08-09 NOTE — Progress Notes (Signed)
  Echocardiogram 2D Echocardiogram has been performed.  William Daniels 08/09/2021, 3:33 PM

## 2021-08-09 NOTE — ED Provider Notes (Signed)
[facility] Provider Note   CSN: [id number] Arrival date & time: 08/08/21  2221     History {Add pertinent medical, surgical, social history, OB history to HPI:1} Chief Complaint  Patient presents with   Chest Pain    William Daniels is a 81 y.o. male.  81 year old male with a history of hypertension but no other significant past medical history the presents the ER today with chest pressure.  Patient states this started yesterday afternoon.  Radiated to his left neck.  He went anything but then tonight it getting better so he presents here for further evaluation.  Patient states that the chest pain is worse when he takes a deep breath on the left side.  Does not notice any palpitations, weakness, dyspnea.  No recent trauma, surgery or long car rides or other risk factors for PE.  No lower extremity swelling.   Chest Pain      Home Medications Prior to Admission medications   Not on File      Allergies    Patient has no known allergies.    Review of Systems   Review of Systems  Cardiovascular:  Positive for chest pain.    Physical Exam Updated Vital Signs BP 98/65   Pulse 89   Temp 99 F (37.2 C) (Oral)   Resp (!) 27   SpO2 92%  Physical Exam Vitals and nursing note reviewed.  Constitutional:      Appearance: He is well-developed.  HENT:     Head: Normocephalic and atraumatic.  Cardiovascular:     Rate and Rhythm: Tachycardia present. Rhythm irregular.  Pulmonary:     Effort: Pulmonary effort is normal. No respiratory distress.     Breath sounds: No decreased breath sounds or wheezing.  Abdominal:     General: There is no distension.  Musculoskeletal:        General: Normal range of motion.     Cervical back: Normal range of motion.  Neurological:     Mental Status: He is alert.     ED Results / Procedures / Treatments   Labs (all labs ordered are listed, but only abnormal results are displayed) Labs Reviewed   BASIC METABOLIC PANEL - Abnormal; Notable for the following components:      Result Value   Sodium 132 (*)    Chloride 96 (*)    Glucose, Bld 162 (*)    Creatinine, Ser 1.28 (*)    GFR, Estimated 57 (*)    All other components within normal limits  CBC  MAGNESIUM  BRAIN NATRIURETIC PEPTIDE  TROPONIN I (HIGH SENSITIVITY)  TROPONIN I (HIGH SENSITIVITY)    EKG EKG Interpretation  Date/Time:  Thursday August 08 2021 22:33:41 EDT Ventricular Rate:  106 PR Interval:  212 QRS Duration: 98 QT Interval:  336 QTC Calculation: 446 R Axis:   36 Text Interpretation: Sinus tachycardia with 1st degree A-V block Incomplete right bundle branch block Anterior infarct , age undetermined Abnormal ECG When compared with ECG of 22-Jul-2008 07:57, PREVIOUS ECG IS PRESENT Confirmed by Merrily Pew [phone]) on 08/09/2021 4:15:35 AM   EKG Interpretation  Date/Time:  Friday August 09 2021 04:40:11 EDT Ventricular Rate:  118 PR Interval:  160 QRS Duration: 95 QT Interval:  298 QTC Calculation: 418 R Axis:   4 Text Interpretation: suspect Afib with RVR Paired ventricular premature complexes Consider anterior infarct ST elevation, consider inferior injury Confirmed by Merrily Pew [phone]) on 08/09/2021 6:29:19 AM  Radiology DG Chest 2 View  Result Date: 08/08/2021 CLINICAL DATA:  Central chest pain radiating towards the neck. Shortness of breath. EXAM: CHEST - 2 VIEW COMPARISON:  08/06/2020 FINDINGS: Heart size and pulmonary vascularity are normal. 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 No airspace disease or consolidation. No pleural effusions. No pneumothorax. Linear fibrosis or atelectasis in the left base. Calcification of the aorta. Similar appearance to previous study. IMPRESSION: Emphysematous changes and fibrosis in the lungs. No focal consolidation. Electronically Signed   By: Lucienne Capers M.D.   On: 08/08/2021 23:06    Procedures .Critical  Care  Performed by: Merrily Pew, MD Authorized by: Merrily Pew, MD   Critical care provider statement:    Critical care time (minutes):  30   Critical care was necessary to treat or prevent imminent or life-threatening deterioration of the following conditions:  Circulatory failure   Critical care was time spent personally by me on the following activities:  Development of treatment plan with patient or surrogate, discussions with consultants, evaluation of patient's response to treatment, examination of patient, ordering and review of laboratory studies, ordering and review of radiographic studies, ordering and performing treatments and interventions, pulse oximetry, re-evaluation of patient's condition and review of old charts     Medications Ordered in ED Medications  apixaban (ELIQUIS) tablet 5 mg (has no administration in time range)  aspirin chewable tablet 324 mg (324 mg Oral Given 08/08/21 2236)  diltiazem (CARDIZEM) injection 15 mg (15 mg Intravenous Given 08/09/21 0507)  lactated ringers bolus 1,000 mL (0 mLs Intravenous Stopped 08/09/21 0613)  acetaminophen (TYLENOL) tablet 1,000 mg (1,000 mg Oral Given 08/09/21 0611)  fentaNYL (SUBLIMAZE) injection 50 mcg (50 mcg Intravenous Given 08/09/21 6433)    ED Course/ Medical Decision Making/ A&P                           Medical Decision Making Amount and/or Complexity of Data Reviewed Labs: ordered. Radiology: ordered.  Risk OTC drugs. Prescription drug management.   On exam patient has an irregularly irregular heart rate that jumps around between 110 and 150.  He is also tachypneic and mildly hypoxic.  This with the pleuritic chest pain on the left and negative troponins concerns me for possible pulmonary embolus.  Went and gave Eliquis and diltiazem.  Blood pressure dropped and then slowly improved after the diltiazem.  Heart rate did come below 100 but still appears to be irregular.  Will await results of CT scan prior to  ultimate disposition.   Chads vasc of 3     {Document critical care time when appropriate:1} {Document review of labs and clinical decision tools ie heart score, Chads2Vasc2 etc:1}  {Document your independent review of radiology images, and any outside records:1} {Document your discussion with family members, caretakers, and with consultants:1} {Document social determinants of health affecting pt's care:1} {Document your decision making why or why not admission, treatments were needed:1} Final Clinical Impression(s) / ED Diagnoses Final diagnoses:  None    Rx / DC Orders ED Discharge Orders          Ordered    Amb referral to AFIB Clinic        08/09/21 0448

## 2021-08-09 NOTE — H&P (Addendum)
Cardiology H&P:   Patient ID: William Daniels MRN: [id number]; DOB: 05-10-1940  Admit date: 08/08/2021 Date of Consult: 08/09/2021  PCP:  Kelton Pillar, MD   [facility] Providers Cardiologist:  Elouise Munroe, MD     Patient Profile:   William Daniels is a 81 y.o. male with a hx of HTN, HLD, diet-controlled diabetes who is being seen 08/09/2021 for the evaluation of Atrial Fibrillation at the request of Dr. Regenia Skeeter.  History of Present Illness:   William Daniels is an 81 year old male with past medical history noted above.  He has never been seen by cardiology in the past.  Reports he was in his usual state of health up until yesterday afternoon.  He was making cookies with his wife whenever he developed centralized chest pressure which radiated up into his neck and his head.  Symptoms lingered throughout the afternoon and ultimately presented to the ED for further evaluation.  In the ED his labs showed sodium 132, potassium 4.5, creatinine 1.28, magnesium 2.2, BNP 24, high-sensitivity troponin 4>> 4, WBC 7, hemoglobin 16.2.  Chest x-ray with no edema.  EKG shows sinus tachycardia, 106 bpm.  While in the ED developed atrial fibrillation with RVR.  Repeat EKG at 4 AM showed atrial fibrillation with RVR, 118 bpm.  He was given 15 mg of IV diltiazem but became hypotensive.  Also started on Eliquis 5 mg twice daily.  Still with some mild chest discomfort at the time of interview.  Heart rates mostly in the 100-1 20 range while at rest in the bed.   Past Medical History:  Diagnosis Date   Glaucoma    Hyperlipidemia    Hypertension     History reviewed. No pertinent surgical history.   Home Medications:  Prior to Admission medications   Not on File    Inpatient Medications: Scheduled Meds:  apixaban  5 mg Oral BID   Continuous Infusions:  PRN Meds:   Allergies:   No Known Allergies  Social History:   Social History   Socioeconomic History   Marital status: Married     Spouse name: Not on file   Number of children: Not on file   Years of education: Not on file   Highest education level: Not on file  Occupational History   Not on file  Tobacco Use   Smoking status: Former    Types: Cigarettes   Smokeless tobacco: Never  Substance and Sexual Activity   Alcohol use: Yes    Alcohol/week: 7.0 standard drinks of alcohol    Types: 7 Cans of beer per week   Drug use: Never   Sexual activity: Not on file  Other Topics Concern   Not on file  Social History Narrative   Not on file   Social Determinants of Health   Financial Resource Strain: Not on file  Food Insecurity: Not on file  Transportation Needs: Not on file  Physical Activity: Not on file  Stress: Not on file  Social Connections: Not on file  Intimate Partner Violence: Not on file    Family History:    Family History  Problem Relation Age of Onset   Hypertension Father      ROS:  Please see the history of present illness.   All other ROS reviewed and negative.     Physical Exam/Data:   Vitals:   08/09/21 0630 08/09/21 0723 08/09/21 0730 08/09/21 0734  BP: 91/65  98/66   Pulse: 93  (!) 107   Resp:  14  (!) 22   Temp:  (!) 97.1 F (36.2 C)    TempSrc:  Tympanic    SpO2: 95%  93% 94%   No intake or output data in the 24 hours ending 08/09/21 0845     No data to display           There is no height or weight on file to calculate BMI.  General:  Well nourished, well developed, in no acute distress HEENT: normal Neck: no JVD Vascular: No carotid bruits; Distal pulses 2+ bilaterally Cardiac:  normal S1, S2; Irreg Irreg; no murmur Lungs:  clear to auscultation bilaterally, no wheezing, rhonchi or rales  Abd: soft, nontender, no hepatomegaly  Ext: no edema Musculoskeletal:  No deformities, BUE and BLE strength normal and equal Skin: warm and dry  Neuro:  CNs 2-12 intact, no focal abnormalities noted Psych:  Normal affect   EKG:  The EKG was personally reviewed and  demonstrates:  sinus tachycardia, 106 bpm  Atrial fibrillation with RVR, 118 bpm   Telemetry:  Telemetry was personally reviewed and demonstrates:  sinus rhythm, afib RVR  Relevant CV Studies:  N/a   Laboratory Data:  High Sensitivity Troponin:   Recent Labs  Lab 08/08/21 2240 08/09/21 0133  TROPONINIHS 4 4     Chemistry Recent Labs  Lab 08/08/21 2240 08/09/21 0133  NA 132*  --   K 4.5  --   CL 96*  --   CO2 28  --   GLUCOSE 162*  --   BUN 14  --   CREATININE 1.28*  --   CALCIUM 9.9  --   MG  --  2.2  GFRNONAA 57*  --   ANIONGAP 8  --     No results for input(s): "PROT", "ALBUMIN", "AST", "ALT", "ALKPHOS", "BILITOT" in the last 168 hours. Lipids No results for input(s): "CHOL", "TRIG", "HDL", "LABVLDL", "LDLCALC", "CHOLHDL" in the last 168 hours.  Hematology Recent Labs  Lab 08/08/21 2240  WBC 7.0  RBC 5.10  HGB 16.2  HCT 45.9  MCV 90.0  MCH 31.8  MCHC 35.3  RDW 13.2  PLT 194   Thyroid No results for input(s): "TSH", "FREET4" in the last 168 hours.  BNP Recent Labs  Lab 08/08/21 2240  BNP 24.8    DDimer No results for input(s): "DDIMER" in the last 168 hours.   Radiology/Studies:  CT Angio Chest PE W and/or Wo Contrast  Result Date: 08/09/2021 CLINICAL DATA:  Chest pain.  High probability of pulmonary embolus. EXAM: CT ANGIOGRAPHY CHEST WITH CONTRAST TECHNIQUE: Multidetector CT imaging of the chest was performed using the standard protocol during bolus administration of intravenous contrast. Multiplanar CT image reconstructions and MIPs were obtained to evaluate the vascular anatomy. RADIATION DOSE REDUCTION: This exam was performed according to the departmental dose-optimization program which includes automated exposure control, adjustment of the mA and/or kV according to patient size and/or use of iterative reconstruction technique. CONTRAST:  83m OMNIPAQUE IOHEXOL 350 MG/ML SOLN COMPARISON:  Standard CT chest 08/10/2008 FINDINGS: Cardiovascular:  Heart is enlarged. No substantial pericardial effusion. Coronary artery calcification is evident. Mild atherosclerotic calcification is noted in the wall of the thoracic aorta. Ascending thoracic aorta measures 4.0 cm diameter. There is no filling defect within the opacified pulmonary arteries to suggest the presence of an acute pulmonary embolus. Mediastinum/Nodes: No mediastinal lymphadenopathy. There is no hilar lymphadenopathy. Tiny hiatal hernia. The esophagus has normal imaging features. There is no axillary lymphadenopathy. Lungs/Pleura: No dense consolidative  airspace disease. There is patchy airspace opacity in the dependent lung bases associated with volume loss, likely reflecting atelectasis although superimposed component of pneumonia cannot be excluded. No pleural effusion. No suspicious pulmonary nodule or mass Upper Abdomen: Unremarkable. Musculoskeletal: No worrisome lytic or sclerotic osseous abnormality. Review of the MIP images confirms the above findings. IMPRESSION: 1. No CT evidence for acute pulmonary embolus. 2. Patchy airspace opacity in the dependent lung bases associated with volume loss, likely reflecting atelectasis although superimposed component of pneumonia cannot be excluded. 3. Ascending thoracic aorta measures 4.0 cm diameter. Recommend annual imaging followup by CTA or MRA. This recommendation follows 2010 ACCF/AHA/AATS/ACR/ASA/SCA/SCAI/SIR/STS/SVM Guidelines for the Diagnosis and Management of Patients with Thoracic Aortic Disease. Circulation. 2010; 121: X324-M010. Aortic aneurysm NOS (ICD10-I71.9) 4. Aortic Atherosclerosis (ICD10-I70.0). Electronically Signed   By: Misty Stanley M.D.   On: 08/09/2021 06:52   DG Chest 2 View  Result Date: 08/08/2021 CLINICAL DATA:  Central chest pain radiating towards the neck. Shortness of breath. EXAM: CHEST - 2 VIEW COMPARISON:  08/06/2020 FINDINGS: Heart size and pulmonary vascularity are normal. 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 No airspace disease or consolidation. No pleural effusions. No pneumothorax. Linear fibrosis or atelectasis in the left base. Calcification of the aorta. Similar appearance to previous study. IMPRESSION: Emphysematous changes and fibrosis in the lungs. No focal consolidation. Electronically Signed   By: Lucienne Capers M.D.   On: 08/08/2021 23:06     Assessment and Plan:   William Daniels is a 81 y.o. male with a hx of HTN, HLD who is being seen 08/09/2021 for the evaluation of Atrial Fibrillation at the request of Dr. Regenia Skeeter.  New onset atrial fibrillation: Unclear whether he has been having episodes of paroxysmal atrial fibrillation prior to admission.  Suspect his chest discomfort may be related to his A-fib.  High-sensitivity troponin 4>> 4.  Rates currently in the 100-1 20 range.  Was given IV diltiazem but became hypotensive.  --Admit for observation --Start metoprolol 25 mg twice daily --Continue Eliquis 5 mg twice daily --Check echocardiogram --If we are able to rate control him suspect he may be eligible for discharge over the weekend with follow-up in the office to consider cardioversion.  Hypertension: Stable, did develop hypotension after receiving IV diltiazem --will start metoprolol 25 mg twice daily  Hyperlipidemia: Med reconciliation is pending --Plan to continue home statin  Diabetes: Check hemoglobin A1c --Reports diet controlled   Risk Assessment/Risk Scores:    CHA2DS2-VASc Score = 4  This indicates a 4.8% annual risk of stroke. The patient's score is based upon: CHF History: 0 HTN History: 1 Diabetes History: 1 Stroke History: 0 Vascular Disease History: 0 Age Score: 2 Gender Score: 0  For questions or updates, please contact [place] Please consult [email or web address] for contact info under    Signed, Reino Bellis, NP  08/09/2021 8:45 AM  Patient seen and examined with Harlan Stains, NP.  Agree as above, with the  following exceptions and changes as noted below.  William Daniels is a very pleasant 81 year old gentleman with new onset atrial fibrillation and presentation with central chest pressure which has not changed significantly over the course of his ED presentation.  Troponins unremarkable.  CT PE study performed noting coronary artery calcifications and right heart enlargement, no pulmonary embolism seen.  Hemoglobin is 16.2 and he does note that he snores occasionally. Gen: NAD, CV: Irregular and tachycardic, no murmurs, Lungs: clear, Abd: soft, Extrem: Warm, well perfused, no edema,  4/4 peripheral pulses neuro/Psych: alert and oriented x 3, normal mood and affect. All available labs, radiology testing, previous records reviewed.  Patient presents with new onset atrial fibrillation with a CHADS2 Vasc of 4.  Eliquis has been initiated by ED.  No bleeding concerns from patient though he does have easy bruising.  Can consider stopping aspirin while he is on Eliquis.  For chest discomfort would consider outpatient stress testing, if in atrial fibrillation will pursue nuclear stress if in sinus would prefer coronary CTA.  We will start him on rate control with metoprolol and obtain an echocardiogram for right heart enlargement on CT.  Patient agreement with this plan.  Elouise Munroe, MD 08/09/21 9:23 AM

## 2021-08-09 NOTE — Progress Notes (Signed)
Heart rate is too high for accurate echo at this time. 

## 2021-08-09 NOTE — ED Provider Notes (Signed)
Cardiology has seen and evaluated patient and decided to admit.   William Gambler, MD 08/09/21 [phone]

## 2021-08-10 DIAGNOSIS — I480 Paroxysmal atrial fibrillation: Secondary | ICD-10-CM | POA: Diagnosis not present

## 2021-08-10 LAB — BASIC METABOLIC PANEL
Anion gap: 9 (ref 5–15)
BUN: 14 mg/dL (ref 8–23)
CO2: 24 mmol/L (ref 22–32)
Calcium: 8.7 mg/dL — ABNORMAL LOW (ref 8.9–10.3)
Chloride: 100 mmol/L (ref 98–111)
Creatinine, Ser: 1.44 mg/dL — ABNORMAL HIGH (ref 0.61–1.24)
GFR, Estimated: 49 mL/min — ABNORMAL LOW (ref >60)
Glucose, Bld: 143 mg/dL — ABNORMAL HIGH (ref 70–99)
Potassium: 3.5 mmol/L (ref 3.5–5.1)
Sodium: 133 mmol/L — ABNORMAL LOW (ref 135–145)

## 2021-08-10 LAB — LIPID PANEL
Cholesterol: 113 mg/dL (ref 0–200)
HDL: 56 mg/dL (ref >40)
LDL Cholesterol: 39 mg/dL (ref 0–99)
Total CHOL/HDL Ratio: 2 RATIO
Triglycerides: 90 mg/dL (ref <150)
VLDL: 18 mg/dL (ref 0–40)

## 2021-08-10 MED ORDER — OFF THE BEAT BOOK
Freq: Once | Status: AC
Start: 2021-08-10 — End: 2021-08-10
  Filled 2021-08-10: qty 1

## 2021-08-10 NOTE — Consult Note (Signed)
Electrophysiology Consultation:   Patient ID: William Daniels MRN: [id number]; DOB: 1940-11-17  Admit date: 08/08/2021 Date of Consult: 08/10/2021  PCP:  Kelton Pillar, MD   [facility] Providers Cardiologist:  Elouise Munroe, MD    Patient Profile:   William Daniels is a 81 y.o. male with a hx of HTN, HLD, DM who is being seen 08/10/2021 for the evaluation of sinus node dysfunction at the request of Dr Margaretann Loveless.  History of Present Illness:   William Daniels was initially seen by Dr Margaretann Loveless in consultation on 08/09/2021 for AF w/ RVR. He presented to the hospital with CP. In the ER his HR was elevated > 110. He takes eliquis at home for stroke ppx. Metoprolol was started while inpatient and an echo was ordered. After BB was started the patient had a 13.2s post conversion pause. He is now in sinus rhythm in the 70s. During the pause, he did lose consciousness.   Family at bedside. AF is a new diagnosis.  Past Medical History:  Diagnosis Date   Glaucoma    Hyperlipidemia    Hypertension     History reviewed. No pertinent surgical history.     Inpatient Medications: Scheduled Meds:  apixaban  5 mg Oral BID   famotidine  20 mg Oral Daily   latanoprost  1 drop Both Eyes QPM   simvastatin  40 mg Oral QHS   Continuous Infusions:  PRN Meds: acetaminophen, ondansetron (ZOFRAN) IV  Allergies:   No Known Allergies  Social History:   Social History   Socioeconomic History   Marital status: Married    Spouse name: Not on file   Number of children: Not on file   Years of education: Not on file   Highest education level: Not on file  Occupational History   Not on file  Tobacco Use   Smoking status: Former    Types: Cigarettes   Smokeless tobacco: Never  Substance and Sexual Activity   Alcohol use: Yes    Alcohol/week: 7.0 standard drinks of alcohol    Types: 7 Cans of beer per week   Drug use: Never   Sexual activity: Not on file  Other Topics Concern   Not on file   Social History Narrative   Not on file   Social Determinants of Health   Financial Resource Strain: Not on file  Food Insecurity: Not on file  Transportation Needs: Not on file  Physical Activity: Not on file  Stress: Not on file  Social Connections: Not on file  Intimate Partner Violence: Not on file    Family History:    Family History  Problem Relation Age of Onset   Hypertension Father      ROS:  Please see the history of present illness.   All other ROS reviewed and negative.     Physical Exam/Data:   Vitals:   08/09/21 2058 08/10/21 0019 08/10/21 0451 08/10/21 0742  BP: 121/74 (!) 103/57 103/61 121/65  Pulse: 72 77 72 69  Resp: 20 (!) '25 19 13  '$ Temp: 97.7 F (36.5 C) 98.6 F (37 C) 98.1 F (36.7 C) 98.7 F (37.1 C)  TempSrc: Oral Oral Oral Oral  SpO2: 97% 94% 93% 95%  Weight: 74.9 kg     Height: '5\' 7"'$  (1.702 m)       Intake/Output Summary (Last 24 hours) at 08/10/2021 0859 Last data filed at 08/09/2021 2221 Gross per 24 hour  Intake 250.99 ml  Output 700 ml  Net -449.01  ml      08/09/2021    8:58 PM 08/09/2021    6:57 PM 08/09/2021   11:32 AM  Last 3 Weights  Weight (lbs) 165 lb 3.2 oz 158 lb 1.1 oz 158 lb  Weight (kg) 74.934 kg 71.7 kg 71.668 kg     Body mass index is 25.87 kg/m.  General:  Well nourished, well developed, in no acute distress HEENT: normal Neck: no JVD Vascular: No carotid bruits; Distal pulses 2+ bilaterally Cardiac:  normal S1, S2; RRR; no murmur  Lungs:  clear to auscultation bilaterally, no wheezing, rhonchi or rales  Abd: soft, nontender, no hepatomegaly  Ext: no edema Musculoskeletal:  No deformities, BUE and BLE strength normal and equal Skin: warm and dry  Neuro:  CNs 2-12 intact, no focal abnormalities noted Psych:  Normal affect   EKG:  The EKG was personally reviewed and demonstrates:  sinus rhythm Telemetry:  Telemetry was personally reviewed and demonstrates:  sinus rhythm.  Relevant CV  Studies:  08/09/2021 echo LVEF 60% RV mildly reduced Mild MR Moderate TR   Laboratory Data:  High Sensitivity Troponin:   Recent Labs  Lab 08/08/21 2240 08/09/21 0133  TROPONINIHS 4 4     Chemistry Recent Labs  Lab 08/08/21 2240 08/09/21 0133 08/10/21 0205  NA 132*  --  133*  K 4.5  --  3.5  CL 96*  --  100  CO2 28  --  24  GLUCOSE 162*  --  143*  BUN 14  --  14  CREATININE 1.28*  --  1.44*  CALCIUM 9.9  --  8.7*  MG  --  2.2  --   GFRNONAA 57*  --  49*  ANIONGAP 8  --  9    No results for input(s): "PROT", "ALBUMIN", "AST", "ALT", "ALKPHOS", "BILITOT" in the last 168 hours. Lipids  Recent Labs  Lab 08/10/21 0205  CHOL 113  TRIG 90  HDL 56  LDLCALC 39  CHOLHDL 2.0    Hematology Recent Labs  Lab 08/08/21 2240  WBC 7.0  RBC 5.10  HGB 16.2  HCT 45.9  MCV 90.0  MCH 31.8  MCHC 35.3  RDW 13.2  PLT 194   Thyroid No results for input(s): "TSH", "FREET4" in the last 168 hours.  BNP Recent Labs  Lab 08/08/21 2240  BNP 24.8    DDimer No results for input(s): "DDIMER" in the last 168 hours.   Radiology/Studies:  ECHOCARDIOGRAM COMPLETE  Result Date: 08/09/2021    ECHOCARDIOGRAM REPORT   Patient Name:   William Daniels Date of Exam: 08/09/2021 Medical Rec #:  [id number]     Height: Accession #:    [id number]    Weight:       158.0 lb Date of Birth:  September 07, 1940     BSA:          1.726 m Patient Age:    51 years      BP:           91/71 mmHg Patient Gender: M             HR:           108 bpm. Exam Location:  Inpatient Procedure: 2D Echo Indications:    atrial fibrillation  History:        Patient has no prior history of Echocardiogram examinations.  Sonographer:    Johny Chess RDCS Referring Phys: [id number] [place]  1. Left ventricular ejection fraction, by estimation, is  60 to 65%. The left ventricle has normal function. The left ventricle has no regional wall motion abnormalities. Left ventricular diastolic parameters are  indeterminate.  2. Right ventricular systolic function is mildly reduced. The right ventricular size is mildly enlarged. There is normal pulmonary artery systolic pressure.  3. The mitral valve is abnormal. Mild mitral valve regurgitation. No evidence of mitral stenosis.  4. Tricuspid valve regurgitation is moderate.  5. The aortic valve is tricuspid. There is mild calcification of the aortic valve. There is mild thickening of the aortic valve. Aortic valve regurgitation is not visualized. Aortic valve sclerosis is present, with no evidence of aortic valve stenosis.  6. Aortic dilatation noted. 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  7. The inferior vena cava is normal in size with greater than 50% respiratory variability, suggesting right atrial pressure of 3 mmHg. FINDINGS  Left Ventricle: Left ventricular ejection fraction, by estimation, is 60 to 65%. The left ventricle has normal function. The left ventricle has no regional wall motion abnormalities. The left ventricular internal cavity size was normal in size. There is  no left ventricular hypertrophy. Left ventricular diastolic parameters are indeterminate. Right Ventricle: The right ventricular size is mildly enlarged. No increase in right ventricular wall thickness. Right ventricular systolic function is mildly reduced. There is normal pulmonary artery systolic pressure. The tricuspid regurgitant velocity  is 2.77 m/s, and with an assumed right atrial pressure of 3 mmHg, the estimated right ventricular systolic pressure is 76.7 mmHg. Left Atrium: Left atrial size was normal in size. Right Atrium: Right atrial size was normal in size. Pericardium: There is no evidence of pericardial effusion. Mitral Valve: The mitral valve is abnormal. There is mild thickening of the mitral valve leaflet(s). There is mild calcification of the mitral valve leaflet(s). Mild mitral annular  calcification. Mild mitral valve regurgitation. No evidence of mitral valve stenosis. Tricuspid Valve: The tricuspid valve is normal in structure. Tricuspid valve regurgitation is moderate . No evidence of tricuspid stenosis. Aortic Valve: The aortic valve is tricuspid. There is mild calcification of the aortic valve. There is mild thickening of the aortic valve. Aortic valve regurgitation is not visualized. Aortic valve sclerosis is present, with no evidence of aortic valve stenosis. Pulmonic Valve: The pulmonic valve was normal in structure. Pulmonic valve regurgitation is trivial. No evidence of pulmonic stenosis. Aorta: Aortic dilatation noted. 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 Venous: The inferior vena cava is normal in size with greater than 50% respiratory variability, suggesting right atrial pressure of 3 mmHg. IAS/Shunts: The interatrial septum was not well visualized.  LEFT VENTRICLE PLAX 2D LVIDd:         3.40 cm LVIDs:         2.20 cm LV PW:         1.00 cm LV IVS:        1.00 cm LVOT diam:     1.80 cm LVOT Area:     2.54 cm  RIGHT VENTRICLE            IVC RV S prime:     8.70 cm/s  IVC diam: 1.80 cm LEFT ATRIUM             Index        RIGHT ATRIUM  Index LA diam:        2.90 cm 1.68 cm/m   RA Area:     11.30 cm LA Vol (A2C):   28.7 ml 16.63 ml/m  RA Volume:   20.90 ml  12.11 ml/m LA Vol (A4C):   49.8 ml 28.86 ml/m LA Biplane Vol: 40.0 ml 23.18 ml/m   AORTA Ao Root diam: 3.80 cm Ao Asc diam:  4.00 cm TRICUSPID VALVE TR Peak grad:   30.7 mmHg TR Vmax:        277.00 cm/s  SHUNTS Systemic Diam: 1.80 cm Jenkins Rouge MD Electronically signed by Jenkins Rouge MD Signature Date/Time: 08/09/2021/3:35:46 PM    Final    CT Angio Chest PE W and/or Wo Contrast  Result Date: 08/09/2021 CLINICAL DATA:  Chest pain.  High probability of pulmonary embolus. EXAM: CT ANGIOGRAPHY CHEST WITH CONTRAST TECHNIQUE:  Multidetector CT imaging of the chest was performed using the standard protocol during bolus administration of intravenous contrast. Multiplanar CT image reconstructions and MIPs were obtained to evaluate the vascular anatomy. RADIATION DOSE REDUCTION: This exam was performed according to the departmental dose-optimization program which includes automated exposure control, adjustment of the mA and/or kV according to patient size and/or use of iterative reconstruction technique. CONTRAST:  70m OMNIPAQUE IOHEXOL 350 MG/ML SOLN COMPARISON:  Standard CT chest 08/10/2008 FINDINGS: Cardiovascular: Heart is enlarged. No substantial pericardial effusion. Coronary artery calcification is evident. Mild atherosclerotic calcification is noted in the wall of the thoracic aorta. Ascending thoracic aorta measures 4.0 cm diameter. There is no filling defect within the opacified pulmonary arteries to suggest the presence of an acute pulmonary embolus. Mediastinum/Nodes: No mediastinal lymphadenopathy. There is no hilar lymphadenopathy. Tiny hiatal hernia. The esophagus has normal imaging features. There is no axillary lymphadenopathy. Lungs/Pleura: No dense consolidative airspace disease. There is patchy airspace opacity in the dependent lung bases associated with volume loss, likely reflecting atelectasis although superimposed component of pneumonia cannot be excluded. No pleural effusion. No suspicious pulmonary nodule or mass Upper Abdomen: Unremarkable. Musculoskeletal: No worrisome lytic or sclerotic osseous abnormality. Review of the MIP images confirms the above findings. IMPRESSION: 1. No CT evidence for acute pulmonary embolus. 2. Patchy airspace opacity in the dependent lung bases associated with volume loss, likely reflecting atelectasis although superimposed component of pneumonia cannot be excluded. 3. Ascending thoracic aorta measures 4.0 cm diameter. Recommend annual imaging followup by CTA or MRA. This  recommendation follows 2010 ACCF/AHA/AATS/ACR/ASA/SCA/SCAI/SIR/STS/SVM Guidelines for the Diagnosis and Management of Patients with Thoracic Aortic Disease. Circulation. 2010; 121:: H062-B762 Aortic aneurysm NOS (ICD10-I71.9) 4. Aortic Atherosclerosis (ICD10-I70.0). Electronically Signed   By: EMisty StanleyM.D.   On: 08/09/2021 06:52   DG Chest 2 View  Result Date: 08/08/2021 CLINICAL DATA:  Central chest pain radiating towards the neck. Shortness of breath. EXAM: CHEST - 2 VIEW COMPARISON:  08/06/2020 FINDINGS: Heart size and pulmonary vascularity are normal. 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 No airspace disease or consolidation. No pleural effusions. No pneumothorax. Linear fibrosis or atelectasis in the left base. Calcification of the aorta. Similar appearance to previous study. IMPRESSION: Emphysematous changes and fibrosis in the lungs. No focal consolidation. Electronically Signed   By: WLucienne CapersM.D.   On: 08/08/2021 23:06     Assessment and Plan:   Mr MYannuzziis a pleasant 89yoman with paroxysmal AF who has evidence of significant symptomatic sinus node dysfunction with a 13 second post conversion pause. I am asked to weigh in re: PPM  implant.  #Sinus node dysfunction Prolonged pause that was symptomatic with loss of consciousness. Recommend pacemaker. I discussed traditional and lead less pacemakers today. Given new diagnosis of AF, atrial diagnostics would be helpful and have recommended transvenous device.  Risks, benefits, alternatives to PPM implantation were discussed in detail with the patient today. The patient understands that the risks include but are not limited to bleeding, infection, pneumothorax, perforation, tamponade, vascular damage, renal failure, MI, stroke, death, and lead dislodgement and wishes to proceed.   Tentatively plan for early this week. Keep NPO Sunday night in case we are able to add on to Monday's schedule. Hold eliquis  starting now.  For questions or updates, please contact [place] Please consult [email or web address] for contact info under    Signed, Vickie Epley, MD  08/10/2021 8:59 AM

## 2021-08-11 DIAGNOSIS — I495 Sick sinus syndrome: Secondary | ICD-10-CM

## 2021-08-11 LAB — SURGICAL PCR SCREEN
MRSA, PCR: NEGATIVE
Staphylococcus aureus: NEGATIVE

## 2021-08-11 LAB — GLUCOSE, CAPILLARY: Glucose-Capillary: 146 mg/dL — ABNORMAL HIGH (ref 70–99)

## 2021-08-11 MED ORDER — CHLORHEXIDINE GLUCONATE 4 % EX LIQD
60.0000 mL | Freq: Once | CUTANEOUS | Status: AC
  Administered 2021-08-11: 4 via TOPICAL
  Filled 2021-08-11: qty 60

## 2021-08-11 MED ORDER — CEFAZOLIN SODIUM-DEXTROSE 2-4 GM/100ML-% IV SOLN
2.0000 g | INTRAVENOUS | Status: AC
  Administered 2021-08-12: 2 g via INTRAVENOUS
  Filled 2021-08-11: qty 100

## 2021-08-11 MED ORDER — SODIUM CHLORIDE 0.9 % IV SOLN
80.0000 mg | INTRAVENOUS | Status: AC
  Administered 2021-08-12: 80 mg
  Filled 2021-08-11: qty 2

## 2021-08-11 MED ORDER — CHLORHEXIDINE GLUCONATE 4 % EX LIQD
60.0000 mL | Freq: Once | CUTANEOUS | Status: AC
  Administered 2021-08-12: 4 via TOPICAL
  Filled 2021-08-11: qty 60

## 2021-08-11 MED ORDER — SODIUM CHLORIDE 0.9 % IV SOLN: 80.0000 mg | INTRAVENOUS | Status: DC

## 2021-08-11 MED ORDER — SODIUM CHLORIDE 0.9 % IV SOLN: INTRAVENOUS | Status: DC

## 2021-08-11 NOTE — Progress Notes (Signed)
MD returned call with no new orders.  Will continue to monitor pt closely.  Pt verbalized understanding to call nursing staff with any symptoms.

## 2021-08-11 NOTE — Progress Notes (Signed)
   08/11/21 0042  Assess: MEWS Score  BP (!) 144/94  MAP (mmHg) 107  Pulse Rate (!) 136  ECG Heart Rate (!) 137  Resp 16  Level of Consciousness Alert  SpO2 97 %  O2 Device Room Air  Assess: MEWS Score  MEWS Temp 0  MEWS Systolic 0  MEWS Pulse 3  MEWS RR 0  MEWS LOC 0  MEWS Score 3  MEWS Score Color Yellow  Assess: if the MEWS score is Yellow or Red  Were vital signs taken at a resting state? Yes  Focused Assessment No change from prior assessment  Does the patient meet 2 or more of the SIRS criteria? No  MEWS guidelines implemented *See Row Information* Yes  Treat  Pain Scale 0-10  Pain Score 0  Patients Stated Pain Goal 0  Take Vital Signs  Increase Vital Sign Frequency  Yellow: Q 2hr X 2 then Q 4hr X 2, if remains yellow, continue Q 4hrs  Escalate  MEWS: Escalate Yellow: discuss with charge nurse/RN and consider discussing with provider and RRT  Notify: Charge Nurse/RN  Name of Charge Nurse/RN Notified Glenna Durand, RN  Date Charge Nurse/RN Notified 08/11/21  Time Charge Nurse/RN Notified 0042  Notify: Provider  Provider Name/Title Dr. Humphrey Rolls  Date Provider Notified 08/11/21  Time Provider Notified 0045  Method of Notification Page  Notification Reason Other (Comment) (converted back to afib)  Provider response No new orders  Date of Provider Response 08/11/21  Time of Provider Response 0047  Assess: SIRS CRITERIA  SIRS Temperature  0  SIRS Pulse 1  SIRS Respirations  0  SIRS WBC 1  SIRS Score Sum  2

## 2021-08-11 NOTE — Progress Notes (Signed)
Mobility Specialist Progress Note    08/11/21 1226  Mobility  Activity Ambulated independently in hallway  Level of Assistance Standby assist, set-up cues, supervision of patient - no hands on  Assistive Device None  Distance Ambulated (ft) 450 ft  Activity Response Tolerated well  $Mobility charge 1 Mobility   Pre-Mobility: 71 HR During Mobility: 96 HR  Pt received in bed and agreeable. No complaints on walk. Returned to room with RN present.   Hildred Alamin Mobility Specialist

## 2021-08-11 NOTE — Progress Notes (Signed)
Patient converted to NSR at 0844hrs.  States he was sleeping at time.  BP 94/76.  PA Meng notified. Order for EKG.  Will continue to monitor.

## 2021-08-11 NOTE — Progress Notes (Signed)
Pt noted to convert to afib rvr 110s-140s.  Asymptomatic, mentions awakened with a coughing spell.  BP 144/94. Cardiology notified.

## 2021-08-11 NOTE — Progress Notes (Signed)
Progress Note  Patient Name: William Daniels Date of Encounter: 08/11/2021  [facility] Cardiologist: Elouise Munroe, MD   Subjective   Back in AF. Feels poorly while in AF.  Inpatient Medications    Scheduled Meds:  famotidine  20 mg Oral Daily   latanoprost  1 drop Both Eyes QPM   simvastatin  40 mg Oral QHS   Continuous Infusions:  PRN Meds: acetaminophen, ondansetron (ZOFRAN) IV   Vital Signs    Vitals:   08/10/21 2027 08/11/21 0042 08/11/21 0219 08/11/21 0444  BP: (!) 166/91 (!) 144/94 112/80 115/70  Pulse: 78 (!) 136 (!) 125 (!) 117  Resp: '15 16 17 16  '$ Temp: 99.7 F (37.6 C)   98.2 F (36.8 C)  TempSrc: Oral   Oral  SpO2: 97% 97%  95%  Weight:      Height:       No intake or output data in the 24 hours ending 08/11/21 0739    08/09/2021    8:58 PM 08/09/2021    6:57 PM 08/09/2021   11:32 AM  Last 3 Weights  Weight (lbs) 165 lb 3.2 oz 158 lb 1.1 oz 158 lb  Weight (kg) 74.934 kg 71.7 kg 71.668 kg      Telemetry    AF w/ RVR. Rates in 140s. - Personally Reviewed  ECG    Personally Reviewed  Physical Exam   GEN: No acute distress.   Neck: No JVD Cardiac: irregularly irregular, tachycardic, no murmurs, rubs, or gallops.  Respiratory: Clear to auscultation bilaterally. GI: Soft, nontender, non-distended  MS: No edema; No deformity. Neuro:  Nonfocal  Psych: Normal affect   Labs    High Sensitivity Troponin:   Recent Labs  Lab 08/08/21 2240 08/09/21 0133  TROPONINIHS 4 4     Chemistry Recent Labs  Lab 08/08/21 2240 08/09/21 0133 08/10/21 0205  NA 132*  --  133*  K 4.5  --  3.5  CL 96*  --  100  CO2 28  --  24  GLUCOSE 162*  --  143*  BUN 14  --  14  CREATININE 1.28*  --  1.44*  CALCIUM 9.9  --  8.7*  MG  --  2.2  --   GFRNONAA 57*  --  49*  ANIONGAP 8  --  9    Lipids  Recent Labs  Lab 08/10/21 0205  CHOL 113  TRIG 90  HDL 56  LDLCALC 39  CHOLHDL 2.0    Hematology Recent Labs  Lab 08/08/21 2240  WBC 7.0   RBC 5.10  HGB 16.2  HCT 45.9  MCV 90.0  MCH 31.8  MCHC 35.3  RDW 13.2  PLT 194   Thyroid No results for input(s): "TSH", "FREET4" in the last 168 hours.  BNP Recent Labs  Lab 08/08/21 2240  BNP 24.8    DDimer No results for input(s): "DDIMER" in the last 168 hours.   Radiology    ECHOCARDIOGRAM COMPLETE  Result Date: 08/09/2021    ECHOCARDIOGRAM REPORT   Patient Name:   William Daniels Date of Exam: 08/09/2021 Medical Rec #:  [id number]     Height: Accession #:    [id number]    Weight:       158.0 lb Date of Birth:  17-May-1940     BSA:          1.726 m Patient Age:    81 years      BP:  91/71 mmHg Patient Gender: M             HR:           108 bpm. Exam Location:  Inpatient Procedure: 2D Echo Indications:    atrial fibrillation  History:        Patient has no prior history of Echocardiogram examinations.  Sonographer:    Johny Chess RDCS Referring Phys: [id number] [place]  1. Left ventricular ejection fraction, by estimation, is 60 to 65%. The left ventricle has normal function. The left ventricle has no regional wall motion abnormalities. Left ventricular diastolic parameters are indeterminate.  2. Right ventricular systolic function is mildly reduced. The right ventricular size is mildly enlarged. There is normal pulmonary artery systolic pressure.  3. The mitral valve is abnormal. Mild mitral valve regurgitation. No evidence of mitral stenosis.  4. Tricuspid valve regurgitation is moderate.  5. The aortic valve is tricuspid. There is mild calcification of the aortic valve. There is mild thickening of the aortic valve. Aortic valve regurgitation is not visualized. Aortic valve sclerosis is present, with no evidence of aortic valve stenosis.  6. Aortic dilatation noted. 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  7. The inferior vena cava is normal in size with greater than 50%  respiratory variability, suggesting right atrial pressure of 3 mmHg. FINDINGS  Left Ventricle: Left ventricular ejection fraction, by estimation, is 60 to 65%. The left ventricle has normal function. The left ventricle has no regional wall motion abnormalities. The left ventricular internal cavity size was normal in size. There is  no left ventricular hypertrophy. Left ventricular diastolic parameters are indeterminate. Right Ventricle: The right ventricular size is mildly enlarged. No increase in right ventricular wall thickness. Right ventricular systolic function is mildly reduced. There is normal pulmonary artery systolic pressure. The tricuspid regurgitant velocity  is 2.77 m/s, and with an assumed right atrial pressure of 3 mmHg, the estimated right ventricular systolic pressure is 65.9 mmHg. Left Atrium: Left atrial size was normal in size. Right Atrium: Right atrial size was normal in size. Pericardium: There is no evidence of pericardial effusion. Mitral Valve: The mitral valve is abnormal. There is mild thickening of the mitral valve leaflet(s). There is mild calcification of the mitral valve leaflet(s). Mild mitral annular calcification. Mild mitral valve regurgitation. No evidence of mitral valve stenosis. Tricuspid Valve: The tricuspid valve is normal in structure. Tricuspid valve regurgitation is moderate . No evidence of tricuspid stenosis. Aortic Valve: The aortic valve is tricuspid. There is mild calcification of the aortic valve. There is mild thickening of the aortic valve. Aortic valve regurgitation is not visualized. Aortic valve sclerosis is present, with no evidence of aortic valve stenosis. Pulmonic Valve: The pulmonic valve was normal in structure. Pulmonic valve regurgitation is trivial. No evidence of pulmonic stenosis. Aorta: Aortic dilatation noted. 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 Venous: The  inferior vena cava is normal in size with greater than 50% respiratory variability, suggesting right atrial pressure of 3 mmHg. IAS/Shunts: The interatrial septum was not well visualized.  LEFT VENTRICLE PLAX 2D LVIDd:         3.40 cm LVIDs:         2.20 cm LV PW:         1.00 cm LV IVS:  1.00 cm LVOT diam:     1.80 cm LVOT Area:     2.54 cm  RIGHT VENTRICLE            IVC RV S prime:     8.70 cm/s  IVC diam: 1.80 cm LEFT ATRIUM             Index        RIGHT ATRIUM           Index LA diam:        2.90 cm 1.68 cm/m   RA Area:     11.30 cm LA Vol (A2C):   28.7 ml 16.63 ml/m  RA Volume:   20.90 ml  12.11 ml/m LA Vol (A4C):   49.8 ml 28.86 ml/m LA Biplane Vol: 40.0 ml 23.18 ml/m   AORTA Ao Root diam: 3.80 cm Ao Asc diam:  4.00 cm TRICUSPID VALVE TR Peak grad:   30.7 mmHg TR Vmax:        277.00 cm/s  SHUNTS Systemic Diam: 1.80 cm William Rouge MD Electronically signed by William Rouge MD Signature Date/Time: 08/09/2021/3:35:46 PM    Final       Assessment & Plan    William Daniels is a pleasant 81yo man with paroxysmal AF who has evidence of significant symptomatic sinus node dysfunction with a 13 second post conversion pause. I am asked to weigh in re: PPM implant.   #Sinus node dysfunction #Tachy-brady syndrome Prolonged pause with loss of consciousness. Now with AF w/ RVR. Plan for DDD PPM Monday pending lab schedule. [employer] device.  Risks, benefits, alternatives to PPM implantation were discussed in detail with the patient today. The patient understands that the risks include but are not limited to bleeding, infection, pneumothorax, perforation, tamponade, vascular damage, renal failure, MI, stroke, death, and lead dislodgement and wishes to proceed.    Keep NPO Sunday night. Hold Eliquis in anticipation of implant.   For questions or updates, please contact [place] Please consult [email or web address] for contact info under        Signed, Vickie Epley, MD  08/11/2021,  7:39 AM

## 2021-08-11 NOTE — Progress Notes (Signed)
Monitor tech notified RN patient in Afib, when checked patient back in NSR.  Denies symptoms, laying in bed.  Review of monitor indicates patient had several brief episodes of afib.  PA Meng notified.

## 2021-08-12 ENCOUNTER — Encounter (HOSPITAL_COMMUNITY)
Admission: EM | Disposition: A | Discharge status: Home / Self Care | Payer: Self-pay | Source: Home / Self Care | Attending: Internal Medicine

## 2021-08-12 DIAGNOSIS — I495 Sick sinus syndrome: Secondary | ICD-10-CM | POA: Diagnosis not present

## 2021-08-12 HISTORY — PX: PACEMAKER IMPLANT: EP1218

## 2021-08-12 SURGERY — PACEMAKER IMPLANT
Anesthesia: LOCAL

## 2021-08-12 MED ORDER — HEPARIN (PORCINE) IN NACL 1000-0.9 UT/500ML-% IV SOLN
INTRAVENOUS | Status: AC
  Filled 2021-08-12: qty 500

## 2021-08-12 MED ORDER — LIDOCAINE HCL 1 % IJ SOLN
INTRAMUSCULAR | Status: AC
  Filled 2021-08-12: qty 60

## 2021-08-12 MED ORDER — FENTANYL CITRATE (PF) 100 MCG/2ML IJ SOLN
INTRAMUSCULAR | Status: AC
  Filled 2021-08-12: qty 2

## 2021-08-12 MED ORDER — HEPARIN (PORCINE) IN NACL 1000-0.9 UT/500ML-% IV SOLN
INTRAVENOUS | Status: DC | PRN
  Administered 2021-08-12: 500 mL

## 2021-08-12 MED ORDER — MIDAZOLAM HCL 5 MG/5ML IJ SOLN
INTRAMUSCULAR | Status: DC | PRN
  Administered 2021-08-12: 1 mg via INTRAVENOUS

## 2021-08-12 MED ORDER — MIDAZOLAM HCL 5 MG/5ML IJ SOLN
INTRAMUSCULAR | Status: AC
  Filled 2021-08-12: qty 5

## 2021-08-12 MED ORDER — FENTANYL CITRATE (PF) 100 MCG/2ML IJ SOLN
INTRAMUSCULAR | Status: DC | PRN
  Administered 2021-08-12: 25 ug via INTRAVENOUS

## 2021-08-12 MED ORDER — SODIUM CHLORIDE 0.9 % IV SOLN: INTRAVENOUS | Status: DC

## 2021-08-12 MED ORDER — SODIUM CHLORIDE 0.9 % IV SOLN
INTRAVENOUS | Status: AC
  Filled 2021-08-12: qty 2

## 2021-08-12 MED ORDER — LIDOCAINE HCL (PF) 1 % IJ SOLN
INTRAMUSCULAR | Status: DC | PRN
  Administered 2021-08-12: 60 mL

## 2021-08-12 SURGICAL SUPPLY — 8 items
CABLE SURGICAL S-101-97-12 (CABLE) ×3 IMPLANT
LEAD INGEVITY 7840 45 (Lead) ×1 IMPLANT
LEAD INGEVITY 7841 52 (Lead) ×1 IMPLANT
PACEMAKER ACCOLADE DR-EL (Pacemaker) ×1 IMPLANT
PAD DEFIB RADIO PHYSIO CONN (PAD) ×3 IMPLANT
SHEATH 7FR PRELUDE SNAP 13 (SHEATH) ×2 IMPLANT
SHEATH PROBE COVER 6X72 (BAG) ×1 IMPLANT
TRAY PACEMAKER INSERTION (PACKS) ×3 IMPLANT

## 2021-08-12 NOTE — Progress Notes (Signed)
Electrophysiology Rounding Note  Patient Name: William Daniels Date of Encounter: 08/12/2021  Primary Cardiologist: Elouise Munroe, MD Electrophysiologist: Dr. Quentin Ore   Subjective   NAEO. Continues to have intermittent AF  Inpatient Medications    Scheduled Meds:  famotidine  20 mg Oral Daily   gentamicin (GARAMYCIN) 80 mg in sodium chloride 0.9 % 500 mL irrigation  80 mg Irrigation On Call   latanoprost  1 drop Both Eyes QPM   simvastatin  40 mg Oral QHS   Continuous Infusions:  sodium chloride 50 mL/hr at 08/12/21 0604    ceFAZolin (ANCEF) IV     PRN Meds: acetaminophen, ondansetron (ZOFRAN) IV   Vital Signs    Vitals:   08/11/21 1557 08/11/21 2100 08/12/21 0030 08/12/21 0536  BP: 100/63 123/75 118/75 118/76  Pulse: 73 73 74 85  Resp: '18 16 16 16  '$ Temp: 98.4 F (36.9 C) 97.8 F (36.6 C) 98.1 F (36.7 C) 97.6 F (36.4 C)  TempSrc: Oral Oral Oral Oral  SpO2: 96% 94% 96% 95%  Weight:    (!) 169 kg  Height:        Intake/Output Summary (Last 24 hours) at 08/12/2021 0711 Last data filed at 08/12/2021 0400 Gross per 24 hour  Intake 720 ml  Output 275 ml  Net 445 ml   Filed Weights   08/09/21 1857 08/09/21 2058 08/12/21 0536  Weight: 71.7 kg 74.9 kg (!) 169 kg    Physical Exam    GEN- The patient is well appearing, alert and oriented x 3 today.   Head- normocephalic, atraumatic Eyes-  Sclera clear, conjunctiva pink Ears- hearing intact Oropharynx- clear Neck- supple Lungs- Clear to ausculation bilaterally, normal work of breathing Heart- Irregular rate and rhythm due to ectopy, no murmurs, rubs or gallops GI- soft, NT, ND, + BS Extremities- no clubbing or cyanosis. No edema Skin- no rash or lesion Psych- euthymic mood, full affect Neuro- strength and sensation are intact  Labs    CBC No results for input(s): "WBC", "NEUTROABS", "HGB", "HCT", "MCV", "PLT" in the last 72 hours. Basic Metabolic Panel Recent Labs    08/10/21 0205  NA 133*   K 3.5  CL 100  CO2 24  GLUCOSE 143*  BUN 14  CREATININE 1.44*  CALCIUM 8.7*   Liver Function Tests No results for input(s): "AST", "ALT", "ALKPHOS", "BILITOT", "PROT", "ALBUMIN" in the last 72 hours. No results for input(s): "LIPASE", "AMYLASE" in the last 72 hours. Cardiac Enzymes No results for input(s): "CKTOTAL", "CKMB", "CKMBINDEX", "TROPONINI" in the last 72 hours.   Telemetry    NSR 60-70s this am with frequent atrial ectopy (personally reviewed)  Radiology    No results found.  Patient Profile     William Daniels is a pleasant 81yo man with paroxysmal AF who has evidence of significant symptomatic sinus node dysfunction with a 13 second post conversion pause. EP asked to weigh in re: PPM implant.  Assessment & Plan    Sinus node dysfunction 2. Tachy-brady syndrome Pt has had prolonged pause with loss of consciousness, then followed by AF w/ RVR. Plan for DDD PPM today pending lab schedule. [employer]  Explained risks, benefits, and alternatives to PPM implantation, including but not limited to bleeding, infection, pneumothorax, pericardial effusion, lead dislodgement, heart attack, stroke, or death.  Pt verbalized understanding and agrees to proceed if indicated.   3. AF with RVR Eliquis on hold for procedures Increase rate control as needed post device  For questions or  updates, please contact [place] Please consult [email or web address] for contact info under Cardiology/STEMI.  Signed, Shirley Friar, PA-C  08/12/2021, 7:11 AM

## 2021-08-12 NOTE — Progress Notes (Signed)
Mobility Specialist Progress Note    08/12/21 1617  Mobility  Activity Contraindicated/medical hold   Pt going for procedure. Will f/u as appropriate.   Hildred Alamin Mobility Specialist

## 2021-08-12 NOTE — Care Management (Signed)
  Transition of Care [facility]) Screening Note   Patient Details  Name: William Daniels Date of Birth: Nov 05, 1940   Transition of Care [facility]) CM/SW Contact:    Bethena Roys, RN Phone Number: 08/12/2021, 4:34 PM    Transition of Care Department [facility]) has reviewed the patient and no TOC needs have been identified at this time. We will continue to monitor patient advancement through interdisciplinary progression rounds. If new patient transition needs arise, please place a TOC consult.

## 2021-08-13 ENCOUNTER — Other Ambulatory Visit (HOSPITAL_COMMUNITY): Payer: Self-pay

## 2021-08-13 ENCOUNTER — Inpatient Hospital Stay (HOSPITAL_COMMUNITY): Payer: Medicare PPO

## 2021-08-13 ENCOUNTER — Encounter (HOSPITAL_COMMUNITY): Payer: Self-pay | Admitting: Cardiology

## 2021-08-13 DIAGNOSIS — I495 Sick sinus syndrome: Secondary | ICD-10-CM | POA: Diagnosis not present

## 2021-08-13 LAB — GLUCOSE, CAPILLARY: Glucose-Capillary: 242 mg/dL — ABNORMAL HIGH (ref 70–99)

## 2021-08-13 MED ORDER — APIXABAN 5 MG PO TABS
5.0000 mg | ORAL_TABLET | Freq: Two times a day (BID) | ORAL | 11 refills | Status: DC
  Filled 2021-08-13: qty 60, 30d supply, fill #0

## 2021-08-13 MED ORDER — ASPIRIN 81 MG PO TBEC
81.0000 mg | DELAYED_RELEASE_TABLET | Freq: Every day | ORAL | 12 refills | Status: DC
  Filled 2021-08-13: qty 30, 30d supply, fill #0

## 2021-08-13 NOTE — Discharge Summary (Signed)
ELECTROPHYSIOLOGY PROCEDURE DISCHARGE SUMMARY    Patient ID: William Daniels,  MRN: [id number], DOB/AGE: 1941/01/11 81 y.o.  Admit date: 08/08/2021 Discharge date: 08/13/2021  Primary Care Physician: Kelton Pillar, MD  Primary Cardiologist: Elouise Munroe, MD  Electrophysiologist: Dr. Quentin Ore  Primary Discharge Diagnosis:  Symptomatic bradycardia status post pacemaker implantation this admission  Secondary Discharge Diagnosis:  Paroxysmal atrial fibrillation  No Known Allergies   Procedures This Admission:  1.  Implantation of a [employer] PPM on 08/12/2021 by Dr. Quentin Ore. The patient received a Patent attorney MRI [phone] with a [place] [phone] right atrial lead and a [employer] [phone] right ventricular lead. There were no immediate post procedure complications.   2.  CXR on 08/13/21 demonstrated no pneumothorax status post device implantation.    Brief HPI: [name] is a 81 y.o. male was admitted for chest pain and found to have new AF RVR.  He was started on BB and then had a 13.2s conversion pause with loss of consciousness. Electrophysiology was asked to see for consideration of PPM implantation tachy-brady syndrome.  Past medical history includes above.  The patient has had symptomatic bradycardia but will require rate control moving forward for atrial fibrillation with RVR at times. Risks, benefits, and alternatives to PPM implantation were reviewed with the patient who wished to proceed.   Hospital Course:  The patient was admitted and underwent implantation of a [employer] dual chamber PPM with details as outlined above.  He was monitored on telemetry overnight which demonstrated NSR.  Left chest was without hematoma or ecchymosis.  The device was interrogated and found to be functioning normally.  CXR was obtained and demonstrated no pneumothorax status post device implantation.  Wound care,  arm mobility, and restrictions were reviewed with the patient.  The patient was examined and considered stable for discharge to home.    Anticoagulation resumption This patient should resume their Eliquis on Sunday, 08/18/2021 with the am dose.    Physical Exam: Vitals:   08/12/21 2125 08/13/21 0032 08/13/21 0641 08/13/21 0808  BP: 119/76 118/74 119/68 113/70  Pulse: 85 86 79 86  Resp: '16 16 16 16  '$ Temp: 98.8 F (37.1 C) 98.7 F (37.1 C) 98.5 F (36.9 C) 97.8 F (36.6 C)  TempSrc: Oral Oral Oral Axillary  SpO2: 100%     Weight:      Height:        GEN- The patient is well appearing, alert and oriented x 3 today.   HEENT: normocephalic, atraumatic; sclera clear, conjunctiva pink; hearing intact; oropharynx clear; neck supple, no JVP Lymph- no cervical lymphadenopathy Lungs- Clear to ausculation bilaterally, normal work of breathing.  No wheezes, rales, rhonchi Heart- Regular rate and rhythm, no murmurs, rubs or gallops, PMI not laterally displaced GI- soft, non-tender, non-distended, bowel sounds present, no hepatosplenomegaly Extremities- no clubbing, cyanosis, or edema; DP/PT/radial pulses 2+ bilaterally MS- no significant deformity or atrophy Skin- warm and dry, no rash or lesion, left chest without hematoma/ecchymosis Psych- euthymic mood, full affect Neuro- strength and sensation are intact   Labs:   Lab Results  Component Value Date   WBC 7.0 08/08/2021   HGB 16.2 08/08/2021   HCT 45.9 08/08/2021   MCV 90.0 08/08/2021   PLT 194 08/08/2021    Recent Labs  Lab 08/10/21 0205  NA 133*  K 3.5  CL 100  CO2 24  BUN 14  CREATININE 1.44*  CALCIUM 8.7*  GLUCOSE 143*    Discharge Medications:  Allergies as of 08/13/2021   No Known Allergies      Medication List     TAKE these medications    Advil 200 MG tablet Generic drug: ibuprofen Take 400 mg by mouth daily as needed for headache or mild pain.   apixaban 5 MG Tabs tablet Commonly known as:  ELIQUIS Take 1 tablet (5 mg total) by mouth 2 (two) times daily. Start taking on: August 18, 2021   aspirin EC 81 MG tablet Take 1 tablet (81 mg total) by mouth daily. Swallow whole. Start taking on: August 18, 2021 What changed: These instructions start on August 18, 2021. If you are unsure what to do until then, ask your doctor or other care provider.   Centrum Silver 50+Men Tabs Take 1 tablet by mouth daily.   famotidine 20 MG tablet Commonly known as: PEPCID Take 20 mg by mouth daily.   fenofibrate 160 MG tablet Take 160 mg by mouth daily.   latanoprost 0.005 % ophthalmic solution Commonly known as: XALATAN Place 1 drop into both eyes every evening.   lisinopril 5 MG tablet Commonly known as: ZESTRIL Take 5 mg by mouth daily.   simvastatin 40 MG tablet Commonly known as: ZOCOR Take 40 mg by mouth at bedtime.   SYSTANE HYDRATION PF OP Place 1 drop into both eyes daily as needed (dry eye).   VITAMIN C PO Take 1 tablet by mouth daily.   VITAMIN D-3 PO Take 1 capsule by mouth daily.        Disposition:  Discharge Instructions     Amb referral to AFIB Clinic   Complete by: As directed        Follow-up Information     [place] Follow up.   Why: on 8/3 at 320 for post hospital pacemaker check follow up Contact information: [place] [phone]                Duration of Discharge Encounter: Greater than 30 minutes including physician time.  Jacalyn Lefevre, PA-C  08/13/2021 8:41 AM

## 2021-08-13 NOTE — Care Management Important Message (Signed)
Important Message  Patient Details  Name: William Daniels MRN: [id number] Date of Birth: 1941-01-04   Medicare Important Message Given:  Yes     Shelda Altes 08/13/2021, 12:10 PM

## 2021-08-13 NOTE — Plan of Care (Signed)
  Problem: Education: Goal: Knowledge of General Education information will improve Description: Including pain rating scale, medication(s)/side effects and non-pharmacologic comfort measures Outcome: Adequate for Discharge   Problem: Activity: Goal: Risk for activity intolerance will decrease Outcome: Adequate for Discharge   Problem: Nutrition: Goal: Adequate nutrition will be maintained Outcome: Adequate for Discharge   Problem: Pain Managment: Goal: General experience of comfort will improve Outcome: Adequate for Discharge   Problem: Coping: Goal: Level of anxiety will decrease Outcome: Adequate for Discharge   Problem: Safety: Goal: Ability to remain free from injury will improve Outcome: Adequate for Discharge   Problem: Skin Integrity: Goal: Risk for impaired skin integrity will decrease Outcome: Adequate for Discharge   Problem: Cardiac: Goal: Ability to achieve and maintain adequate cardiopulmonary perfusion will improve Outcome: Adequate for Discharge

## 2021-08-22 ENCOUNTER — Ambulatory Visit (INDEPENDENT_AMBULATORY_CARE_PROVIDER_SITE_OTHER): Payer: Medicare PPO

## 2021-08-22 DIAGNOSIS — I495 Sick sinus syndrome: Secondary | ICD-10-CM | POA: Diagnosis not present

## 2021-08-22 DIAGNOSIS — I480 Paroxysmal atrial fibrillation: Secondary | ICD-10-CM

## 2021-08-22 NOTE — Patient Instructions (Addendum)
   After Your Pacemaker   Monitor your pacemaker site for redness, swelling, and drainage. Call the device clinic at [phone] if you experience these symptoms or fever/chills.  Keep Incision site dry until wound re-check on 08/29/2021 at 10:00   You may use a hot tub or a pool after your wound check appointment if the incision is completely closed.  Do not lift, push or pull greater than 10 pounds with the affected arm until September 23 2021 . There are no other restrictions in arm movement after your wound check appointment.  Your Pacemaker is MRI compatible.  Remote monitoring is used to monitor your pacemaker from home. This monitoring is scheduled every 91 days by our office. It allows [place] to keep an eye on the functioning of your device to ensure it is working properly. You will routinely see your Electrophysiologist annually (more often if necessary).

## 2021-08-23 ENCOUNTER — Other Ambulatory Visit: Payer: Self-pay

## 2021-08-23 LAB — CUP PACEART INCLINIC DEVICE CHECK
Date Time Interrogation Session: 20230803000000
Implantable Lead Implant Date: 20230724
Implantable Lead Implant Date: 20230724
Implantable Lead Location: 753859
Implantable Lead Location: 753860
Implantable Lead Model: 7840
Implantable Lead Model: 7841
Implantable Lead Serial Number: 1061671
Implantable Lead Serial Number: 1297615
Implantable Pulse Generator Implant Date: 20230724
Lead Channel Impedance Value: 601 Ohm
Lead Channel Impedance Value: 644 Ohm
Lead Channel Pacing Threshold Amplitude: 0.8 V
Lead Channel Pacing Threshold Amplitude: 0.9 V
Lead Channel Pacing Threshold Pulse Width: 0.4 ms
Lead Channel Pacing Threshold Pulse Width: 0.4 ms
Lead Channel Sensing Intrinsic Amplitude: 1.9 mV
Lead Channel Sensing Intrinsic Amplitude: 21 mV
Lead Channel Setting Pacing Amplitude: 3.5 V
Lead Channel Setting Pacing Amplitude: 3.5 V
Lead Channel Setting Pacing Pulse Width: 0.4 ms
Lead Channel Setting Sensing Sensitivity: 2.5 mV
Pulse Gen Serial Number: 597897

## 2021-08-23 MED ORDER — APIXABAN 5 MG PO TABS: 5.0000 mg | ORAL_TABLET | Freq: Two times a day (BID) | ORAL | 11 refills | Status: DC

## 2021-08-23 NOTE — Progress Notes (Signed)
Wound check appointment. Steri-strips removed. Wound without redness or edema. Right lateral corner of incision site un-approximated steri strips applied.  Normal device function. Thresholds, sensing, and impedances consistent with implant measurements. Device programmed at 3.5V/auto capture programmed on for extra safety margin until 3 month visit. Histogram distribution appropriate for patient and level of activity. 4% AT/AF + Eliquis, longest episode 9hrs in duration, average V rate 107bpm. 5 Nonsustained V episodes  EGMs illustrate AF w/ RVR. Patient educated about wound care, arm mobility, lifting restrictions. ROV 08/29/2021 for wound re-check.

## 2021-08-27 ENCOUNTER — Telehealth: Payer: Self-pay | Admitting: Cardiology

## 2021-08-27 NOTE — Telephone Encounter (Signed)
Late entry: Spoke with patient who reports he had elevated BP/HR last night of 133/117, HR 154 with CP, SOB and headache. States he was sitting on couch watching TV when this occurred.   Patient states this morning he feels "off, floaty," but BP/HR this morning were 113/64, HR 92. Patient denies CP, SOB.   Patient had pacemaker placed by Dr. Quentin Ore 7/24 with F/U scheduled with Dr. Quentin Ore on 11/19/21.  Patient and spouse have multiple questions about device. Device team has contacted patient today after taking this call.  Patient to follow-up with Tommye Standard, PA on 08/29/21.  Advised patient to call if symptoms return, advised on ED precautions, patient verbalized understanding.

## 2021-08-27 NOTE — Telephone Encounter (Signed)
I spoke with the patient and he states he do not have chest pains this morning. He woke up with a headache but it went away. He feels off. I asked the patient to send a manual transmission for the nurse to review.  He left a voicemail stating he had some bad chest pains last night. His blood pressure was high and his pulse was 154 bpm.  I let him speak with Benjamine Mola, rn.

## 2021-08-27 NOTE — Telephone Encounter (Signed)
Transmission reviewed, patient in AF during chest pain/ patient HR elevated during AF, apt scheduled with R. Ursuy on 08/29/21 to discuss elevated HR during AF as well as remove steri-strips

## 2021-08-27 NOTE — Telephone Encounter (Signed)
Pt c/o of Chest Pain: STAT if CP now or developed within 24 hours  1. Are you having CP right now? No  2. Are you experiencing any other symptoms (ex. SOB, nausea, vomiting, sweating)? Headache, SOB and a little bit of sweating  3. How long have you been experiencing CP? It started early yesterday evening 08/07  4. Is your CP continuous or coming and going? Continuous yesterday, but then let up. This morning pt states he just feels off.   5. Have you taken Nitroglycerin? No  Pt just had pacemaker implanted 07/24 ?

## 2021-08-29 ENCOUNTER — Ambulatory Visit: Payer: Medicare PPO

## 2021-08-29 ENCOUNTER — Ambulatory Visit (INDEPENDENT_AMBULATORY_CARE_PROVIDER_SITE_OTHER): Payer: Medicare PPO | Admitting: Physician Assistant

## 2021-08-29 ENCOUNTER — Encounter: Payer: Self-pay | Admitting: Physician Assistant

## 2021-08-29 VITALS — BP 128/72 | HR 59 | Ht 67.0 in | Wt 158.0 lb

## 2021-08-29 DIAGNOSIS — Z5189 Encounter for other specified aftercare: Secondary | ICD-10-CM | POA: Diagnosis not present

## 2021-08-29 DIAGNOSIS — Z950 Presence of cardiac pacemaker: Secondary | ICD-10-CM

## 2021-08-29 DIAGNOSIS — I10 Essential (primary) hypertension: Secondary | ICD-10-CM

## 2021-08-29 DIAGNOSIS — I480 Paroxysmal atrial fibrillation: Secondary | ICD-10-CM | POA: Diagnosis not present

## 2021-08-29 LAB — CUP PACEART INCLINIC DEVICE CHECK
Date Time Interrogation Session: 20230810173802
Implantable Lead Implant Date: 20230724
Implantable Lead Implant Date: 20230724
Implantable Lead Location: 753859
Implantable Lead Location: 753860
Implantable Lead Model: 7840
Implantable Lead Model: 7841
Implantable Lead Serial Number: 1061671
Implantable Lead Serial Number: 1297615
Implantable Pulse Generator Implant Date: 20230724
Lead Channel Impedance Value: 572 Ohm
Lead Channel Impedance Value: 652 Ohm
Lead Channel Pacing Threshold Amplitude: 0.6 V
Lead Channel Pacing Threshold Amplitude: 0.9 V
Lead Channel Pacing Threshold Pulse Width: 0.4 ms
Lead Channel Pacing Threshold Pulse Width: 0.4 ms
Lead Channel Sensing Intrinsic Amplitude: 2.3 mV
Lead Channel Sensing Intrinsic Amplitude: 22.2 mV
Lead Channel Setting Pacing Amplitude: 3.5 V
Lead Channel Setting Pacing Amplitude: 3.5 V
Lead Channel Setting Pacing Pulse Width: 0.4 ms
Lead Channel Setting Sensing Sensitivity: 2.5 mV
Pulse Gen Serial Number: 597897

## 2021-08-29 MED ORDER — METOPROLOL SUCCINATE ER 25 MG PO TB24: 25.0000 mg | ORAL_TABLET | Freq: Every day | ORAL | 6 refills | Status: DC

## 2021-08-29 MED ORDER — DILTIAZEM HCL 30 MG PO TABS: 30.0000 mg | ORAL_TABLET | Freq: Four times a day (QID) | ORAL | 1 refills | Status: DC | PRN

## 2021-08-29 NOTE — Progress Notes (Signed)
Cardiology Office Note Date:  08/29/2021  Patient ID:  William Daniels, William Daniels 05-10-1940, MRN [id number] PCP:  Kelton Pillar, MD  Cardiologist:  Dr. Margaretann Loveless Electrophysiologist: Dr. Quentin Ore    Chief Complaint:  wound check, RVR  History of Present Illness: William Daniels is a 81 y.o. male with history of HTN, HLD, DM (diet managed), glaucoma, AFib, tachy-brady w/PPM  He was admitted 08/08/21 with c/o CP, initially in [place] though developed Afib w/RVR, a/c and BB started.  Labs unremarkable, neg HS Trop.  Developed 13 second post termination pause.  EP was called and had PPM implanted for tachy-brady. Discharged 08/13/21 instructed to resume Eliquis 08/18/21.  He had his wound check visit, he very medial aspect of his incision not healed and steri strips reapplied.  TODAY He comes today accompanied by his wife, they have many questions. He had an episode of Afib the other night rates got to the 150's, these make him feel poorly with chest heaviness. When not in Afib or going fast he feels well. They were under the impression the pacer was taking care of the Afib and both of disappointed when he had more. Saturday he had some oozing from that medial edge. None sine then. No near syncope or syncope. No SOB   Device information [employer] dual chamber PPM implanted 08/12/21  AFib/AAD hx Diagnosed July 2023 No AAD to date   Past Medical History:  Diagnosis Date   Glaucoma    Hyperlipidemia    Hypertension     Past Surgical History:  Procedure Laterality Date   PACEMAKER IMPLANT N/A 08/12/2021   Procedure: PACEMAKER IMPLANT;  Surgeon: Vickie Epley, MD;  Location: [facility];  Service: Cardiovascular;  Laterality: N/A;    Current Outpatient Medications  Medication Sig Dispense Refill   apixaban (ELIQUIS) 5 MG TABS tablet Take 1 tablet (5 mg total) by mouth 2 (two) times daily. 60 tablet 11   Ascorbic Acid (VITAMIN C PO) Take 1 tablet by mouth daily.     aspirin EC 81 MG  tablet Take 1 tablet (81 mg total) by mouth daily. Swallow whole. 30 tablet 12   Cholecalciferol (VITAMIN D-3 PO) Take 1 capsule by mouth daily.     famotidine (PEPCID) 20 MG tablet Take 20 mg by mouth daily.     fenofibrate 160 MG tablet Take 160 mg by mouth daily.     ibuprofen (ADVIL) 200 MG tablet Take 400 mg by mouth daily as needed for headache or mild pain.     latanoprost (XALATAN) 0.005 % ophthalmic solution Place 1 drop into both eyes every evening.     lisinopril (ZESTRIL) 5 MG tablet Take 5 mg by mouth daily.     Multiple Vitamins-Minerals (CENTRUM SILVER 50+MEN) TABS Take 1 tablet by mouth daily.     Polyethyl Glycol-Propyl Glycol (SYSTANE HYDRATION PF OP) Place 1 drop into both eyes daily as needed (dry eye).     simvastatin (ZOCOR) 40 MG tablet Take 40 mg by mouth at bedtime.     No current facility-administered medications for this visit.    Allergies:   Patient has no known allergies.   Social History:  The patient  reports that he has quit smoking. His smoking use included cigarettes. He has never used smokeless tobacco. He reports current alcohol use of about 7.0 standard drinks of alcohol per week. He reports that he does not use drugs.   Family History:  The patient's family history includes Hypertension in his father.  ROS:  Please see the history of present illness.    All other systems are reviewed and otherwise negative.   PHYSICAL EXAM:  VS:  There were no vitals taken for this visit. BMI: There is no height or weight on file to calculate BMI. Well nourished, well developed, in no acute distress HEENT: normocephalic, atraumatic Neck: no JVD, carotid bruits or masses Cardiac:   RRR; no significant murmurs, no rubs, or gallops Lungs:   CTA b/l, no wheezing, rhonchi or rales Abd: soft, nontender MS: no deformity or atrophy Ext: no edema Skin: warm and dry, no rash Neuro:  No gross deficits appreciated Psych: euthymic mood, full affect   PPM site: steri  strips are removed. No active bleeding but edges again, not healed at the medial 2-75m.  No erythema, edema, tenderness, no heat The rest of the incision is well healed There is no evidence of infection   EKG:  not done today   Device interrogation done today and reviewed by myself:  Battery and lead measurements are stable AFib burden 6% and generally fast NSVT episodes, EGMs available are RVR, not NSVT   08/09/21: TTE  1. Left ventricular ejection fraction, by estimation, is 60 to 65%. The  left ventricle has normal function. The left ventricle has no regional  wall motion abnormalities. Left ventricular diastolic parameters are  indeterminate.   2. Right ventricular systolic function is mildly reduced. The right  ventricular size is mildly enlarged. There is normal pulmonary artery  systolic pressure.   3. The mitral valve is abnormal. Mild mitral valve regurgitation. No  evidence of mitral stenosis.   4. Tricuspid valve regurgitation is moderate.   5. The aortic valve is tricuspid. There is mild calcification of the  aortic valve. There is mild thickening of the aortic valve. Aortic valve  regurgitation is not visualized. Aortic valve sclerosis is present, with  no evidence of aortic valve stenosis.   6. Aortic dilatation noted. 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   7. The inferior vena cava is normal in size with greater than 50%  respiratory variability, suggesting right atrial pressure of 3 mmHg.   Recent Labs: 08/08/2021: B Natriuretic Peptide 24.8; Hemoglobin 16.2; Platelets 194 08/09/2021: Magnesium 2.2 08/10/2021: BUN 14; Creatinine, Ser 1.44; Potassium 3.5; Sodium 133  08/10/2021: Cholesterol 113; HDL 56; LDL Cholesterol 39; Total CHOL/HDL Ratio 2.0; Triglycerides 90; VLDL 18   CrCl cannot be calculated (Unknown ideal weight.).   Wt Readings from Last 3 Encounters:   08/12/21 (!) 372 lb 9.2 oz (169 kg)     Other studies reviewed: Additional studies/records reviewed today include: summarized above  ASSESSMENT AND PLAN:  PPM Intact function Acute implant outputs remain No programming changes made  Paroxysmal Afib CHA2DS2Vasc is 4, on Eliquis, appropriately dosed 6 % burden fast rates Start Toprol '25mg'$  daily today, if we need more rate control would peel away his ACE to accommodate nodal blocker Diltiazem '30mg'$  Q6 PRN for Afib/palpitations  Discussed at length today AFib, management strategies and realistic expectations. BB worked well in the hospital Hopefully we can get good control with add on of  BB Discussed when and how to take the PRN diltiazem  Reviewed activity restrictions, asked not to shower until completely healed I think it is OK for him to drive  HTN Looks good   Disposition: F/u with u[place]oreanext week for another wound  check  Current medicines are reviewed at length with the patient today.  The patient did not have any concerns regarding medicines.  Venetia Night, PA-C 08/29/2021 6:49 AM     [place] [address] [phone] (office)  [phone] (fax)

## 2021-08-29 NOTE — Patient Instructions (Addendum)
Medication Instructions:   STOP TAKING: IBUPROFEN    START TAKING: TOPROL 25 MG ONCE A DAY   START TAKING:  DILTIAZEM 30 MG ONCE A DAY AS NEEDED  EVERY 6 HOURS FOR AFIB   *If you need a refill on your cardiac medications before your next appointment, please call your pharmacy*   Lab Work: NONE ORDERED  TODAY   If you have labs (blood work) drawn today and your tests are completely normal, you will receive your results only by: MyChart Message (if you have MyChart) OR A paper copy in the mail If you have any lab test that is abnormal or we need to change your treatment, we will call you to review the results.   Testing/Procedures: NONE ORDERED  TODAY   Follow-Up: At [facility], you and your health needs are our priority.  As part of our continuing mission to provide you with exceptional heart care, we have created designated Provider Care Teams.  These Care Teams include your primary Cardiologist (physician) and Advanced Practice Providers (APPs -  Physician Assistants and Nurse Practitioners) who all work together to provide you with the care you need, when you need it.  We recommend signing up for the patient portal called "MyChart".  Sign up information is provided on this After Visit Summary.  MyChart is used to connect with patients for Virtual Visits (Telemedicine).  Patients are able to view lab/test results, encounter notes, upcoming appointments, etc.  Non-urgent messages can be sent to your provider as well.   To learn more about what you can do with MyChart, go to [email or web address].    Your next appointment:    2 -3 week(s)  AFIB CLINIC   (CODE TO GET IN  GATE 1403)  WITH [facility] OR DEVICE CLINIC FOR WOUND CHECK   The format for your next appointment:   In Person  Provider:    You will see one of the following Advanced Practice Providers on your designated Care Team:     Legrand Como "Jonni Sanger" Chalmers Cater, [place]   Other Instructions   Important Information About  [place]

## 2021-09-06 ENCOUNTER — Ambulatory Visit (INDEPENDENT_AMBULATORY_CARE_PROVIDER_SITE_OTHER): Payer: Medicare PPO | Admitting: Student

## 2021-09-06 DIAGNOSIS — Z5189 Encounter for other specified aftercare: Secondary | ICD-10-CM

## 2021-09-06 NOTE — Progress Notes (Signed)
Repeat wound check.   3 remaining steri-strips removed.     Area underneath well healed. No bleeding, drainage, or wound.   OK to shower and let warm soapy water run over. No scrubbing at the area. Pat dry.   Pt had no other questions.   Legrand Como [address]" [place], PA-C  09/06/2021 10:31 AM

## 2021-09-18 ENCOUNTER — Ambulatory Visit (HOSPITAL_COMMUNITY)
Admission: RE | Admit: 2021-09-18 | Discharge: 2021-09-18 | Disposition: A | Discharge status: Home / Self Care | Payer: Medicare PPO | Source: Ambulatory Visit | Attending: Nurse Practitioner | Admitting: Nurse Practitioner

## 2021-09-18 VITALS — BP 84/50 | HR 94 | Ht 67.0 in | Wt 164.0 lb

## 2021-09-18 DIAGNOSIS — D6869 Other thrombophilia: Secondary | ICD-10-CM

## 2021-09-18 DIAGNOSIS — I4891 Unspecified atrial fibrillation: Secondary | ICD-10-CM | POA: Insufficient documentation

## 2021-09-18 NOTE — Progress Notes (Signed)
Primary Care Physician: Kelton Pillar, MD Referring Physician: Dr. Fabian Sharp is a 81 y.o. male with a h/o HTN  admitted for chest pain 08/08/21 and found to have new AF RVR.  He was started on BB and then had a 13.2s conversion pause with loss of consciousness. Electrophysiology was asked to see for consideration of PPM implantation tachy-brady syndrome. The patient has had symptomatic bradycardia but will require rate control moving forward for atrial fibrillation with RVR at times. Risks, benefits, and alternatives to PPM implantation were reviewed with the patient who wished to proceed.   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 He is now s/p PPM. Site is well healed and he is in SR today.   He has had a low grade fever, cough and nasal drainage for a couple of days. He had something similar in December, cleared on its own in a few days without antibiotics. No coughing while in office. Many questions answered for pt and wife re afib, meds to treat  and PPM. His BP is soft today at 84/60 so will stop low dose lisinopril.   Drinks a daily beer, minimal caffeine, mostly decaf products . No significant snoring or apnea reported by wife. No tobacco. He remains on a daily asa, pt/wife feel it is for h/o of colon ca. Will discuss with pcp if it needs to be continued as now on eliquis and may increase issues with bleeding.   Today, he denies symptoms of palpitations, chest pain, shortness of breath, orthopnea, PND, lower extremity edema, dizziness, presyncope, syncope, or neurologic sequela. The patient is tolerating medications without difficulties and is otherwise without complaint today.   Past Medical History:  Diagnosis Date   Glaucoma    Hyperlipidemia    Hypertension    Past Surgical History:  Procedure Laterality Date   PACEMAKER IMPLANT N/A 08/12/2021   Procedure: PACEMAKER IMPLANT;  Surgeon:  Vickie Epley, MD;  Location: [facility];  Service: Cardiovascular;  Laterality: N/A;    Current Outpatient Medications  Medication Sig Dispense Refill   acetaminophen (TYLENOL) 500 MG tablet Take 1,000 mg by mouth every 6 (six) hours as needed for mild pain.     apixaban (ELIQUIS) 5 MG TABS tablet Take 1 tablet (5 mg total) by mouth 2 (two) times daily. 60 tablet 11   Ascorbic Acid (VITAMIN C PO) Take 1 tablet by mouth daily.     aspirin EC 81 MG tablet Take 1 tablet (81 mg total) by mouth daily. Swallow whole. 30 tablet 12   cetirizine (ZYRTEC) 10 MG tablet Take 10 mg by mouth daily.     Cholecalciferol (VITAMIN D-3 PO) Take 1 capsule by mouth daily.     diltiazem (CARDIZEM) 30 MG tablet Take 1 tablet (30 mg total) by mouth every 6 (six) hours as needed (FOR AFIB EPISODES). 30 tablet 1   famotidine (PEPCID) 20 MG tablet Take 20 mg by mouth daily.     fenofibrate 160 MG tablet Take 160 mg by mouth daily.     latanoprost (XALATAN) 0.005 % ophthalmic solution Place 1 drop into both eyes every evening.     lisinopril (ZESTRIL) 5 MG tablet Take 5 mg by mouth daily.     metoprolol succinate (TOPROL XL) 25 MG 24 hr tablet Take 1 tablet (25 mg total) by mouth daily. 30 tablet 6   Multiple Vitamins-Minerals (  CENTRUM SILVER 50+MEN) TABS Take 1 tablet by mouth daily.     Polyethyl Glycol-Propyl Glycol (SYSTANE HYDRATION PF OP) Place 1 drop into both eyes daily as needed (dry eye).     simvastatin (ZOCOR) 40 MG tablet Take 40 mg by mouth at bedtime.     No current facility-administered medications for this encounter.    No Known Allergies  Social History   Socioeconomic History   Marital status: Married    Spouse name: Not on file   Number of children: Not on file   Years of education: Not on file   Highest education level: Not on file  Occupational History   Not on file  Tobacco Use   Smoking status: Former    Types: Cigarettes   Smokeless tobacco: Never  Substance and  Sexual Activity   Alcohol use: Yes    Alcohol/week: 7.0 standard drinks of alcohol    Types: 7 Cans of beer per week   Drug use: Never   Sexual activity: Not on file  Other Topics Concern   Not on file  Social History Narrative   Not on file   Social Determinants of Health   Financial Resource Strain: Not on file  Food Insecurity: Not on file  Transportation Needs: Not on file  Physical Activity: Not on file  Stress: Not on file  Social Connections: Not on file  Intimate Partner Violence: Not on file    Family History  Problem Relation Age of Onset   Hypertension Father     ROS- All systems are reviewed and negative except as per the HPI above  Physical Exam: Vitals:   09/18/21 0831  Pulse: 94  Weight: 74.4 kg  Height: '5\' 7"'$  (1.702 m)   Wt Readings from Last 3 Encounters:  09/18/21 74.4 kg  08/29/21 71.7 kg  08/12/21 (!) 169 kg    Labs: Lab Results  Component Value Date   NA 133 (L) 08/10/2021   K 3.5 08/10/2021   CL 100 08/10/2021   CO2 24 08/10/2021   GLUCOSE 143 (H) 08/10/2021   BUN 14 08/10/2021   CREATININE 1.44 (H) 08/10/2021   CALCIUM 8.7 (L) 08/10/2021   MG 2.2 08/09/2021   Lab Results  Component Value Date   INR 1.0 08/16/2008   Lab Results  Component Value Date   CHOL 113 08/10/2021   HDL 56 08/10/2021   LDLCALC 39 08/10/2021   TRIG 90 08/10/2021     GEN- The patient is well appearing, alert and oriented x 3 today.   Head- normocephalic, atraumatic Eyes-  Sclera clear, conjunctiva pink Ears- hearing intact Oropharynx- clear Neck- supple, no JVP Lymph- no cervical lymphadenopathy Lungs- Clear to ausculation bilaterally, normal work of breathing Heart- Regular rate and rhythm, no murmurs, rubs or gallops, PMI not laterally displaced GI- soft, NT, ND, + BS Extremities- no clubbing, cyanosis, or edema MS- no significant deformity or atrophy Skin- no rash or lesion Psych- euthymic mood, full affect Neuro- strength and sensation  are intact  EKG-PR interval 148 ms QRS duration 92 ms QT/QTcB 328/410 ms P-R-T axes 70 69 54 Sinus rhythm with marked sinus arrhythmia with occasional Premature ventricular complexes Septal infarct , age undetermined Abnormal ECG When compared with ECG of 13-Aug-2021 06:37, No significant change since last tracing Confirmed by Gwyndolyn Kaufman [phone]) on 09/18/2021 8:03:17 PM    Assessment and Plan:  1. Afib  New onset General education re afib Continue metoprolol succinate 25 mg daily  Advised to avoid  nightly beer   2.[id number]  score of at least 2 Continue eliquis 5 mg bid   3. 13 second conversion pause with LOC S/p PPM  Site well healed  Per Dr. Quentin Ore and device clinic  4. Low grade fever with cough and sinus drainage To PCP if symptoms do not improve   Can try robitussin without DM for cough and antihistamines for sinus drainage   F/u with Dr. Quentin Ore as scheduled   Geroge Baseman. Saulo Anthis, [facility] [address] [place], [place] [address] [phone]

## 2021-09-18 NOTE — Patient Instructions (Signed)
Stop lisinopril.

## 2021-09-19 ENCOUNTER — Encounter (HOSPITAL_COMMUNITY): Payer: Self-pay | Admitting: Nurse Practitioner

## 2021-09-20 ENCOUNTER — Telehealth: Payer: Self-pay | Admitting: Internal Medicine

## 2021-09-20 DIAGNOSIS — J40 Bronchitis, not specified as acute or chronic: Secondary | ICD-10-CM | POA: Diagnosis not present

## 2021-09-20 DIAGNOSIS — R051 Acute cough: Secondary | ICD-10-CM | POA: Diagnosis not present

## 2021-09-20 DIAGNOSIS — R059 Cough, unspecified: Secondary | ICD-10-CM | POA: Diagnosis not present

## 2021-09-20 DIAGNOSIS — R509 Fever, unspecified: Secondary | ICD-10-CM | POA: Diagnosis not present

## 2021-09-20 DIAGNOSIS — R52 Pain, unspecified: Secondary | ICD-10-CM | POA: Diagnosis not present

## 2021-09-20 DIAGNOSIS — Z03818 Encounter for observation for suspected exposure to other biological agents ruled out: Secondary | ICD-10-CM | POA: Diagnosis not present

## 2021-09-20 NOTE — Telephone Encounter (Signed)
Spoke with patient's wife. Patient has had a cough for a couple of weeks. She thinks this started short after starting metoprolol. She states he coughs up clear phlegm. He went to AFib clinic this week -- provider there said to take Robussin, CXR was discussed but not ordered as he was told that lungs sounded clear. Patient has had weight gain - 5-6lbs in about 1 month. He has had a decreased appetite. He has had a fever in the evenings - 100.7 - took tylenol and slept fairly well. He uses nasal saline spray during the day.   Patient's wife called PCP yesterday but cannot get in with her until 9/13.  Advised to call PCP office to be seen by any provider to eval for infectious process - may need CXR, labs  Patient coughed while on phone and sounds wet, tight.   Advised will send to Butch Penny NP and Dr. Margaretann Loveless (who patient has not yet seen outpatient yet) Wife states "he cannot have another night like he did last night" and wants med changes, something to be done today. Explained that if a source of infection is the cause, PCP would manage this generally.

## 2021-09-20 NOTE — Telephone Encounter (Signed)
Pt is returning call. Requesting return call.  

## 2021-09-20 NOTE — Telephone Encounter (Signed)
William Mire, RN to Me      09/20/21 10:47 AM Provider would recommend pt be assessed at urgent care if unable to see PCP thanks!

## 2021-09-20 NOTE — Telephone Encounter (Signed)
Left message for wife to return call 

## 2021-09-20 NOTE — Telephone Encounter (Signed)
Pt c/o Shortness Of Breath: STAT if SOB developed within the last 24 hours or pt is noticeably SOB on the phone  1. Are you currently SOB (can you hear that pt is SOB on the phone)?  Yes   2. How long have you been experiencing SOB? Occasionally, not sure when it started   3. Are you SOB when sitting or when up moving around? Both   4. Are you currently experiencing any other symptoms? Pt has a cough.

## 2021-09-20 NOTE — Telephone Encounter (Signed)
Returned call to patient who states that he went to the [facility] walk in clinic at his PCP who did a chest x ray that showed bilateral pleural effusions but that it showed no pneumonia so far. Patient states the MD he saw in the clinic prescribed him something for the cough and put him on an antibiotic as well. Advised patient to call back to office with any issues, questions, or concerns. Patient verbalized understanding.

## 2021-09-24 DIAGNOSIS — J90 Pleural effusion, not elsewhere classified: Secondary | ICD-10-CM | POA: Diagnosis not present

## 2021-09-24 DIAGNOSIS — J40 Bronchitis, not specified as acute or chronic: Secondary | ICD-10-CM | POA: Diagnosis not present

## 2021-09-24 DIAGNOSIS — R059 Cough, unspecified: Secondary | ICD-10-CM | POA: Diagnosis not present

## 2021-10-02 ENCOUNTER — Ambulatory Visit: Payer: Medicare PPO | Attending: Internal Medicine | Admitting: Internal Medicine

## 2021-10-02 ENCOUNTER — Encounter: Payer: Self-pay | Admitting: Internal Medicine

## 2021-10-02 VITALS — BP 126/68 | HR 83 | Ht 67.5 in | Wt 154.2 lb

## 2021-10-02 DIAGNOSIS — Z950 Presence of cardiac pacemaker: Secondary | ICD-10-CM | POA: Diagnosis not present

## 2021-10-02 DIAGNOSIS — R072 Precordial pain: Secondary | ICD-10-CM | POA: Diagnosis not present

## 2021-10-02 DIAGNOSIS — I10 Essential (primary) hypertension: Secondary | ICD-10-CM | POA: Diagnosis not present

## 2021-10-02 DIAGNOSIS — D6869 Other thrombophilia: Secondary | ICD-10-CM

## 2021-10-02 DIAGNOSIS — I480 Paroxysmal atrial fibrillation: Secondary | ICD-10-CM | POA: Diagnosis not present

## 2021-10-02 DIAGNOSIS — I495 Sick sinus syndrome: Secondary | ICD-10-CM | POA: Diagnosis not present

## 2021-10-02 DIAGNOSIS — I4891 Unspecified atrial fibrillation: Secondary | ICD-10-CM

## 2021-10-02 MED ORDER — METOPROLOL TARTRATE 50 MG PO TABS: 50.0000 mg | ORAL_TABLET | Freq: Once | ORAL | 0 refills | Status: DC

## 2021-10-02 NOTE — Patient Instructions (Signed)
Medication Instructions:   PLEASE TAKE METOPROLOL TARTRATE '50mg'$  TWO HOURS PRIOR TO CCTA SCAN   PLEASE DO NOT TAKE YOUR METOPROLOL SUCCINATE THE DAY OF THE CCTA SCAN   *If you need a refill on your cardiac medications before your next appointment, please call your pharmacy*  Lab Work: Please return for Blood Work ONE WEEK PRIOR TO [place] No appointment needed, lab here at the office is open Monday-Friday from 8AM to 4PM and closed daily for lunch from 12:45-1:45.   If you have labs (blood work) drawn today and your tests are completely normal, you will receive your results only by: [place] (if you have MyChart) OR A paper copy in the mail If you have any lab test that is abnormal or we need to change your treatment, we will call you to review the results.  Testing/Procedures: Your physician has requested that you have cardiac CT. Cardiac computed tomography (CT) is a painless test that uses an x-ray machine to take clear, detailed pictures of your heart. For further information please visit [email or web address]. Please follow instruction sheet as given.  Your physician has requested that you have an LIMITED echocardiogram. Echocardiography is a painless test that uses sound waves to create images of your heart. It provides your doctor with information about the size and shape of your heart and how well your heart's chambers and valves are working. You may receive an ultrasound enhancing agent through an IV if needed to better visualize your heart during the echo.This procedure takes approximately one hour. There are no restrictions for this procedure. This will take place at the [address].   Follow-Up: At [facility], you and your health needs are our priority.  As part of our continuing mission to provide you with exceptional heart care, we have created designated Provider Care Teams.  These Care Teams include your primary Cardiologist (physician) and  Advanced Practice Providers (APPs -  Physician Assistants and Nurse Practitioners) who all work together to provide you with the care you need, when you need it.  Your next appointment:   1 month(s)  The format for your next appointment:   In Person  Provider:   Elouise Munroe, MD   WITH APP    Your cardiac CT will be scheduled at one of the below locations:   [facility] [address] [place], [place] [address] [phone]  If scheduled at [facility], please arrive at the [facility] Entrance (Entrance C2) of [facility] 30 minutes prior to test start time. You can use the FREE valet parking offered at entrance C (encouraged to control the heart rate for the test)  Proceed to the [facility] Radiology Department (first floor) to check-in and test prep.  All radiology patients and guests should use entrance C2 at [facility], accessed from [facility], even though the hospital's physical address listed is [address].    Please follow these instructions carefully (unless otherwise directed):  Hold all erectile dysfunction medications at least 3 days (72 hrs) prior to test. (Ie viagra, cialis, sildenafil, tadalafil, etc) We will administer nitroglycerin during this exam.   On the Night Before the Test: Be sure to Drink plenty of water. Do not consume any caffeinated/decaffeinated beverages or chocolate 12 hours prior to your test. Do not take any antihistamines 12 hours prior to your test.  On the Day of the Test: Drink plenty of  water until 1 hour prior to the test. You may take your regular medications prior to the test.  Take metoprolol (Lopressor) two hours prior to test. HOLD Furosemide/Hydrochlorothiazide morning of the test.      After the Test: Drink plenty of water. After receiving IV contrast, you may experience a mild flushed feeling. This is normal. On occasion, you may experience a  mild rash up to 24 hours after the test. This is not dangerous. If this occurs, you can take Benadryl 25 mg and increase your fluid intake. If you experience trouble breathing, this can be serious. If it is severe call 911 IMMEDIATELY. If it is mild, please call our office. If you take any of these medications: Glipizide/Metformin, Avandament, Glucavance, please do not take 48 hours after completing test unless otherwise instructed.  We will call to schedule your test 2-4 weeks out understanding that some insurance companies will need an authorization prior to the service being performed.   For non-scheduling related questions, please contact the cardiac imaging nurse navigator should you have any questions/concerns: Marchia Bond, Cardiac Imaging Nurse Navigator Gordy Clement, Cardiac Imaging Nurse Navigator [facility] Direct Office Dial: [phone]   For scheduling needs, including cancellations and rescheduling, please call [place], [phone].

## 2021-10-02 NOTE — Progress Notes (Signed)
Cardiology Office Note:    Date:  10/02/2021   ID:  William Daniels, DOB Nov 27, 1940, MRN [id number]  PCP:  Kelton Pillar, MD  Cardiologist:  Elouise Munroe, MD  Electrophysiologist:  None   Referring MD: Kelton Pillar, MD   Chief Complaint/Reason for Referral: CC: follow-up  History of Present Illness:    William Daniels is a 81 y.o. male with a history of afib, HTN and HLD here for follow up after pacemaker implant.  Today:  Prior to his pacemaker implant, he had chest discomfort with atrial fibrillation rapid rate. Stable device interrogations. No significant recurrence of afib. Continues on DOAC.   He has had a cough since he left the hospital. It is sometimes accompanied by a mild fever of 99.7. It has been getting worse every day. He took an antibiotic course, and had CXRs before and after. A doctor noted that there was improvement on the second CXR, but there is a small amount of fluid in the lungs.  This morning he felt dizzy and had a presyncopal episode. We discussed obtaining an echo given concerning symptoms.   He quit smoking 51 years ago. He takes an Asprin every day.  The patient denies dyspnea at rest or with exertion, palpitations, PND, orthopnea, or leg swelling. Denies chills. Denies nausea, vomiting. Denies syncope. Denies snoring.  Past Medical History:  Diagnosis Date   Glaucoma    Hyperlipidemia    Hypertension     Past Surgical History:  Procedure Laterality Date   PACEMAKER IMPLANT N/A 08/12/2021   Procedure: PACEMAKER IMPLANT;  Surgeon: Vickie Epley, MD;  Location: [facility];  Service: Cardiovascular;  Laterality: N/A;    Current Medications: Current Meds  Medication Sig   acetaminophen (TYLENOL) 500 MG tablet Take 1,000 mg by mouth every 6 (six) hours as needed for mild pain.   apixaban (ELIQUIS) 5 MG TABS tablet Take 1 tablet (5 mg total) by mouth 2 (two) times daily.   Ascorbic Acid (VITAMIN C PO) Take 1 tablet by mouth  daily.   aspirin EC 81 MG tablet Take 1 tablet (81 mg total) by mouth daily. Swallow whole.   cetirizine (ZYRTEC) 10 MG tablet Take 10 mg by mouth daily.   Cholecalciferol (VITAMIN D-3 PO) Take 1 capsule by mouth daily.   diltiazem (CARDIZEM) 30 MG tablet Take 1 tablet (30 mg total) by mouth every 6 (six) hours as needed (FOR AFIB EPISODES).   famotidine (PEPCID) 20 MG tablet Take 20 mg by mouth daily.   fenofibrate 160 MG tablet Take 160 mg by mouth daily.   latanoprost (XALATAN) 0.005 % ophthalmic solution Place 1 drop into both eyes every evening.   metoprolol succinate (TOPROL XL) 25 MG 24 hr tablet Take 1 tablet (25 mg total) by mouth daily.   metoprolol tartrate (LOPRESSOR) 50 MG tablet Take 1 tablet (50 mg total) by mouth once for 1 dose. PLEASE TAKE METOPROLOL 2  HOURS PRIOR TO CTA SCAN.   Multiple Vitamins-Minerals (CENTRUM SILVER 50+MEN) TABS Take 1 tablet by mouth daily.   Polyethyl Glycol-Propyl Glycol (SYSTANE HYDRATION PF OP) Place 1 drop into both eyes daily as needed (dry eye).   simvastatin (ZOCOR) 40 MG tablet Take 40 mg by mouth at bedtime.     Allergies:   Patient has no known allergies.   Social History   Tobacco Use   Smoking status: Former    Types: Cigarettes   Smokeless tobacco: Never  Substance Use Topics   Alcohol  use: Yes    Alcohol/week: 7.0 standard drinks of alcohol    Types: 7 Cans of beer per week   Drug use: Never     Family History: The patient's family history includes Hypertension in his father.  ROS:   Please see the history of present illness.     (+) Cough (+) Fever (+) Dizziness  All other systems reviewed and are negative.  EKGs/Labs/Other Studies Reviewed:    The following studies were reviewed today:  Pacemaker Implant 08/12/21:  CONCLUSIONS:   1.  Symptomatic sinus node dysfunction  2.  Successful dual chamber permanent pacemaker implantation  3.  No early apparent complications.   4.  Hold anticoagulation for 5 days.   Okay to restart August 18, 2021.  Echo 08/09/21:  1. Left ventricular ejection fraction, by estimation, is 60 to 65%. The  left ventricle has normal function. The left ventricle has no regional wall motion abnormalities. Left ventricular diastolic parameters are indeterminate.   2. Right ventricular systolic function is mildly reduced. The right ventricular size is mildly enlarged. There is normal pulmonary artery  systolic pressure.   3. The mitral valve is abnormal. Mild mitral valve regurgitation. No evidence of mitral stenosis.   4. Tricuspid valve regurgitation is moderate.   5. The aortic valve is tricuspid. There is mild calcification of the aortic valve. There is mild thickening of the aortic valve. Aortic valve regurgitation is not visualized. Aortic valve sclerosis is present, with no evidence of aortic valve stenosis.   6. Aortic dilatation noted. 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   7. The inferior vena cava is normal in size with greater than 50% respiratory variability, suggesting right atrial pressure of 3 mmHg.   EKG: EKG is personally reviewed 10/02/21: NSR with PACs. Septal infarct pattern.   Recent Labs: 08/08/2021: B Natriuretic Peptide 24.8; Hemoglobin 16.2; Platelets 194 08/09/2021: Magnesium 2.2 08/10/2021: BUN 14; Creatinine, Ser 1.44; Potassium 3.5; Sodium 133   Recent Lipid Panel    Component Value Date/Time   CHOL 113 08/10/2021 0205   TRIG 90 08/10/2021 0205   HDL 56 08/10/2021 0205   CHOLHDL 2.0 08/10/2021 0205   VLDL 18 08/10/2021 0205   LDLCALC 39 08/10/2021 0205    Physical Exam:    VS:  BP 126/68   Pulse 83   Ht 5' 7.5" (1.715 m)   Wt 154 lb 3.2 oz (69.9 kg)   SpO2 96%   BMI 23.79 kg/m     Wt Readings from Last 5 Encounters:  10/02/21 154 lb 3.2 oz (69.9 kg)  09/18/21 164 lb (74.4 kg)  08/29/21 158 lb (71.7 kg)  08/12/21 (!) 372 lb 9.2 oz (169 kg)     Constitutional: No acute distress Eyes: sclera non-icteric, normal conjunctiva and lids ENMT: normal dentition, moist mucous membranes Cardiovascular: regular rhythm, normal rate, no murmur. S1 and S2 normal. No jugular venous distention.  Respiratory: clear to auscultation bilaterally GI : normal bowel sounds, soft and nontender. No distention.   MSK: extremities warm, well perfused. No edema.  NEURO: grossly nonfocal exam, moves all extremities. PSYCH: alert and oriented x 3, normal mood and affect.   ASSESSMENT:    1. Paroxysmal atrial fibrillation (HCC)   2. Secondary hypercoagulable state ([place])   3. Precordial pain   4. Cardiac pacemaker in situ   5. Primary hypertension   6. Sick sinus syndrome ([place])  PLAN:    Paroxysmal atrial fibrillation ([place]) - Plan: EKG 12-Lead Secondary hypercoagulable state ([place]) - continue eliquis 5 mg BID. On metoprolol succinate 25 mg daily with PPM in place. CHA2DS2-VASc Score = 4  The patient's score is based upon: CHF History: 0 HTN History: 1 Diabetes History: 1 Stroke History: 0 Vascular Disease History: 0 Age Score: 2 Gender Score: 0  Precordial pain - Plan: CT CORONARY MORPH W/CTA COR W/SCORE W/CA W/CM &/OR WO/CM, Basic metabolic panel, ECHOCARDIOGRAM LIMITED - he has had chest discomfort prior to converting to SR which requires review. Discussed that ischemic evaluation is reasonable, will proceed with CCTA now that in SR.  - he has had a persistent cough, likely infectious or post infectious with protracted recovery but will review non-cardiac findings on chest CT as it accompanies CCTA.  -echocardiogram to ensure no interval change s/p hospitalization.   Cardiac pacemaker in situ - very long conversion pause with SSS and now with PPM, stable device pocket and function per EP.  Primary hypertension - bp well controlled , cont metoprolol.   Total time of encounter: 40 minutes total time of encounter, including 30 minutes spent  in face-to-face patient care on the date of this encounter. This time includes coordination of care and counseling regarding above mentioned problem list. Remainder of non-face-to-face time involved reviewing chart documents/testing relevant to the patient encounter and documentation in the medical record. I have independently reviewed documentation from referring provider.   Cherlynn Kaiser, MD, [place]   Shared Decision Making/Informed Consent:       Medication Adjustments/Labs and Tests Ordered: Current medicines are reviewed at length with the patient today.  Concerns regarding medicines are outlined above.   Orders Placed This Encounter  Procedures   CT CORONARY MORPH W/CTA COR W/SCORE W/CA W/CM &/OR WO/CM   Basic metabolic panel   EKG 56-[facility]   ECHOCARDIOGRAM LIMITED    Meds ordered this encounter  Medications   metoprolol tartrate (LOPRESSOR) 50 MG tablet    Sig: Take 1 tablet (50 mg total) by mouth once for 1 dose. PLEASE TAKE METOPROLOL 2  HOURS PRIOR TO CTA SCAN.    Dispense:  1 tablet    Refill:  0    Patient Instructions  Medication Instructions:   PLEASE TAKE METOPROLOL TARTRATE '50mg'$  TWO HOURS PRIOR TO CCTA SCAN   PLEASE DO NOT TAKE YOUR METOPROLOL SUCCINATE THE DAY OF THE CCTA SCAN   *If you need a refill on your cardiac medications before your next appointment, please call your pharmacy*  Lab Work: Please return for Blood Work ONE WEEK PRIOR TO [place] No appointment needed, lab here at the office is open Monday-Friday from 8AM to 4PM and closed daily for lunch from 12:45-1:45.   If you have labs (blood work) drawn today and your tests are completely normal, you will receive your results only by: [place] (if you have MyChart) OR A paper copy in the mail If you have any lab test that is abnormal or we need to change your treatment, we will call you to review the results.  Testing/Procedures: Your physician has requested  that you have cardiac CT. Cardiac computed tomography (CT) is a painless test that uses an x-ray machine to take clear, detailed pictures of your heart. For further information please visit [email or web address]. Please follow instruction sheet as given.  Your physician has requested that you have an LIMITED echocardiogram. Echocardiography is a painless test that uses sound  waves to create images of your heart. It provides your doctor with information about the size and shape of your heart and how well your heart's chambers and valves are working. You may receive an ultrasound enhancing agent through an IV if needed to better visualize your heart during the echo.This procedure takes approximately one hour. There are no restrictions for this procedure. This will take place at the [address].   Follow-Up: At [facility], you and your health needs are our priority.  As part of our continuing mission to provide you with exceptional heart care, we have created designated Provider Care Teams.  These Care Teams include your primary Cardiologist (physician) and Advanced Practice Providers (APPs -  Physician Assistants and Nurse Practitioners) who all work together to provide you with the care you need, when you need it.  Your next appointment:   1 month(s)  The format for your next appointment:   In Person  Provider:   Elouise Munroe, MD   WITH APP    Your cardiac CT will be scheduled at one of the below locations:   [facility] [address] [place], [place] [address] [phone]  If scheduled at [facility], please arrive at the [facility] and Children's Entrance (Entrance C2) of [facility] 30 minutes prior to test start time. You can use the FREE valet parking offered at entrance C (encouraged to control the heart rate for the test)  Proceed to the [facility] Radiology Department (first floor) to check-in and test prep.  All  radiology patients and guests should use entrance C2 at [facility], accessed from [facility], even though the hospital's physical address listed is [address]    Please follow these instructions carefully (unless otherwise directed):  Hold all erectile dysfunction medications at least 3 days (72 hrs) prior to test. (Ie viagra, cialis, sildenafil, tadalafil, etc) We will administer nitroglycerin during this exam.   On the Night Before the Test: Be sure to Drink plenty of water. Do not consume any caffeinated/decaffeinated beverages or chocolate 12 hours prior to your test. Do not take any antihistamines 12 hours prior to your test.  On the Day of the Test: Drink plenty of water until 1 hour prior to the test. You may take your regular medications prior to the test.  Take metoprolol (Lopressor) two hours prior to test. HOLD Furosemide/Hydrochlorothiazide morning of the test.      After the Test: Drink plenty of water. After receiving IV contrast, you may experience a mild flushed feeling. This is normal. On occasion, you may experience a mild rash up to 24 hours after the test. This is not dangerous. If this occurs, you can take Benadryl 25 mg and increase your fluid intake. If you experience trouble breathing, this can be serious. If it is severe call 911 IMMEDIATELY. If it is mild, please call our office. If you take any of these medications: Glipizide/Metformin, Avandament, Glucavance, please do not take 48 hours after completing test unless otherwise instructed.  We will call to schedule your test 2-4 weeks out understanding that some insurance companies will need an authorization prior to the service being performed.   For non-scheduling related questions, please contact the cardiac imaging nurse navigator should you have any questions/concerns: Marchia Bond, Cardiac Imaging Nurse Navigator Gordy Clement, Cardiac Imaging Nurse Navigator [facility] Direct Office Dial: [phone]   For  scheduling needs, including cancellations and rescheduling, please call [place], [phone].         I,Mary Mosetta Pigeon Buren,acting as a scribe for Elouise Munroe, MD.,have documented all relevant documentation on the behalf of Elouise Munroe, MD,as directed by  Elouise Munroe, MD while in the presence of Elouise Munroe, MD.  I, Elouise Munroe, MD, have reviewed all documentation for the visit on 10/02/2021. The documentation on today's date of service for the exam, diagnosis, procedures, and orders are all accurate and complete.

## 2021-10-04 ENCOUNTER — Telehealth: Payer: Self-pay | Admitting: Internal Medicine

## 2021-10-04 NOTE — Telephone Encounter (Signed)
Patient's wife called in because the patient saw Dr. Margaretann Loveless earlier this week and they discussed him getting a KardiaMobile device. She looked these up on [employer] and they are not compatible with a pacemaker, which he does have. She states that this conflicts with what Dr. Margaretann Loveless said so she would like more clarification.

## 2021-10-04 NOTE — Telephone Encounter (Signed)
Called patient wife.  Patient was seen in office on 09/13- she states it was discussed to use KardiaMobile or another option to view when he is in Afib. She is really concerned about this, however I did discuss I would let Dr.Acharya know this information and we could see what other options she may have.   She states the information states not to be used with patients who have pacemaker or ICD. I advised I would send a message and we would return call with recommendations.   Patient wife verbalized understanding, thankful for call back.

## 2021-10-08 NOTE — Telephone Encounter (Signed)
Called patient, advised of message from both Dr.Acharya and [place].   Patient will notify his wife and will make a decision on what to do.   Thankful for call back.

## 2021-10-10 ENCOUNTER — Other Ambulatory Visit: Payer: Self-pay | Admitting: Family Medicine

## 2021-10-10 ENCOUNTER — Ambulatory Visit
Admission: RE | Admit: 2021-10-10 | Discharge: 2021-10-10 | Disposition: A | Discharge status: Home / Self Care | Payer: Medicare PPO | Source: Ambulatory Visit | Attending: Family Medicine | Admitting: Family Medicine

## 2021-10-10 DIAGNOSIS — I4891 Unspecified atrial fibrillation: Secondary | ICD-10-CM | POA: Diagnosis not present

## 2021-10-10 DIAGNOSIS — J90 Pleural effusion, not elsewhere classified: Secondary | ICD-10-CM

## 2021-10-10 DIAGNOSIS — R000 Tachycardia, unspecified: Secondary | ICD-10-CM | POA: Diagnosis not present

## 2021-10-10 DIAGNOSIS — E1122 Type 2 diabetes mellitus with diabetic chronic kidney disease: Secondary | ICD-10-CM | POA: Diagnosis not present

## 2021-10-10 DIAGNOSIS — R059 Cough, unspecified: Secondary | ICD-10-CM | POA: Diagnosis not present

## 2021-10-10 DIAGNOSIS — Z950 Presence of cardiac pacemaker: Secondary | ICD-10-CM | POA: Diagnosis not present

## 2021-10-11 ENCOUNTER — Ambulatory Visit (HOSPITAL_COMMUNITY): Payer: Medicare PPO | Attending: Internal Medicine

## 2021-10-11 DIAGNOSIS — R072 Precordial pain: Secondary | ICD-10-CM | POA: Diagnosis not present

## 2021-10-14 DIAGNOSIS — E875 Hyperkalemia: Secondary | ICD-10-CM | POA: Diagnosis not present

## 2021-10-15 LAB — ECHOCARDIOGRAM LIMITED
Area-P 1/2: 3.76 cm2
S' Lateral: 2.2 cm

## 2021-10-16 DIAGNOSIS — R072 Precordial pain: Secondary | ICD-10-CM | POA: Diagnosis not present

## 2021-10-17 LAB — BASIC METABOLIC PANEL
BUN/Creatinine Ratio: 12 (ref 10–24)
BUN: 15 mg/dL (ref 8–27)
CO2: 26 mmol/L (ref 20–29)
Calcium: 9.7 mg/dL (ref 8.6–10.2)
Chloride: 93 mmol/L — ABNORMAL LOW (ref 96–106)
Creatinine, Ser: 1.27 mg/dL (ref 0.76–1.27)
Glucose: 165 mg/dL — ABNORMAL HIGH (ref 70–99)
Potassium: 5.4 mmol/L — ABNORMAL HIGH (ref 3.5–5.2)
Sodium: 131 mmol/L — ABNORMAL LOW (ref 134–144)
eGFR: 57 mL/min/{1.73_m2} — ABNORMAL LOW (ref >59)

## 2021-10-21 ENCOUNTER — Telehealth (HOSPITAL_COMMUNITY): Payer: Self-pay | Admitting: *Deleted

## 2021-10-21 NOTE — Telephone Encounter (Signed)
Reaching out to patient to offer assistance regarding upcoming cardiac imaging study; pt verbalizes understanding of appt date/time, parking situation and where to check in,  medications ordered, and verified current allergies; name and call back number provided for further questions should they arise  Gordy Clement RN Navigator Cardiac Imaging [facility] [phone] office [phone] cell  Patient to take '50mg'$  metoprolol tartrate two hours prior to his cardiac CT scan. He is aware to arrive at 11am.

## 2021-10-22 DIAGNOSIS — D225 Melanocytic nevi of trunk: Secondary | ICD-10-CM | POA: Diagnosis not present

## 2021-10-22 DIAGNOSIS — D0462 Carcinoma in situ of skin of left upper limb, including shoulder: Secondary | ICD-10-CM | POA: Diagnosis not present

## 2021-10-22 DIAGNOSIS — D485 Neoplasm of uncertain behavior of skin: Secondary | ICD-10-CM | POA: Diagnosis not present

## 2021-10-22 DIAGNOSIS — L821 Other seborrheic keratosis: Secondary | ICD-10-CM | POA: Diagnosis not present

## 2021-10-22 DIAGNOSIS — L578 Other skin changes due to chronic exposure to nonionizing radiation: Secondary | ICD-10-CM | POA: Diagnosis not present

## 2021-10-22 DIAGNOSIS — Z85828 Personal history of other malignant neoplasm of skin: Secondary | ICD-10-CM | POA: Diagnosis not present

## 2021-10-22 DIAGNOSIS — L570 Actinic keratosis: Secondary | ICD-10-CM | POA: Diagnosis not present

## 2021-10-22 DIAGNOSIS — L814 Other melanin hyperpigmentation: Secondary | ICD-10-CM | POA: Diagnosis not present

## 2021-10-23 ENCOUNTER — Encounter (HOSPITAL_COMMUNITY): Payer: Self-pay

## 2021-10-23 ENCOUNTER — Ambulatory Visit (HOSPITAL_COMMUNITY)
Admission: RE | Admit: 2021-10-23 | Discharge: 2021-10-23 | Disposition: A | Discharge status: Home / Self Care | Payer: Medicare PPO | Source: Ambulatory Visit | Attending: Internal Medicine | Admitting: Internal Medicine

## 2021-10-23 DIAGNOSIS — R072 Precordial pain: Secondary | ICD-10-CM | POA: Insufficient documentation

## 2021-10-23 MED ORDER — SODIUM CHLORIDE 0.9 % IV BOLUS
250.0000 mL | Freq: Once | INTRAVENOUS | Status: AC
  Administered 2021-10-23: 500 mL via INTRAVENOUS

## 2021-10-23 MED ORDER — NITROGLYCERIN 0.4 MG SL SUBL
0.8000 mg | SUBLINGUAL_TABLET | Freq: Once | SUBLINGUAL | Status: AC
  Administered 2021-10-23: 0.8 mg via SUBLINGUAL

## 2021-10-23 MED ORDER — NITROGLYCERIN 0.4 MG SL SUBL
SUBLINGUAL_TABLET | SUBLINGUAL | Status: AC
  Filled 2021-10-23: qty 2

## 2021-10-23 MED ORDER — IOHEXOL 350 MG/ML SOLN
95.0000 mL | Freq: Once | INTRAVENOUS | Status: AC | PRN
  Administered 2021-10-23: 95 mL via INTRAVENOUS

## 2021-10-23 NOTE — Progress Notes (Signed)
   10/23/21 1105  Vital Signs  Pulse Rate 68  BP (!) 83/61 (250 NS bolus given and caffeine given to patient)   Patient states he feels slightly dizzy and off balance. Patient brought to IR nurses station and given 250 NS bolus and caffeine.

## 2021-11-11 ENCOUNTER — Encounter: Payer: Self-pay | Admitting: Physician Assistant

## 2021-11-11 ENCOUNTER — Telehealth: Payer: Self-pay | Admitting: Cardiology

## 2021-11-11 ENCOUNTER — Ambulatory Visit: Payer: Medicare PPO | Attending: Physician Assistant | Admitting: Physician Assistant

## 2021-11-11 VITALS — BP 108/60 | HR 100 | Ht 67.5 in | Wt 151.0 lb

## 2021-11-11 DIAGNOSIS — I480 Paroxysmal atrial fibrillation: Secondary | ICD-10-CM | POA: Diagnosis not present

## 2021-11-11 DIAGNOSIS — I2510 Atherosclerotic heart disease of native coronary artery without angina pectoris: Secondary | ICD-10-CM

## 2021-11-11 DIAGNOSIS — E785 Hyperlipidemia, unspecified: Secondary | ICD-10-CM | POA: Diagnosis not present

## 2021-11-11 DIAGNOSIS — Z950 Presence of cardiac pacemaker: Secondary | ICD-10-CM | POA: Diagnosis not present

## 2021-11-11 NOTE — Telephone Encounter (Signed)
Pt wants to know if his device check tomorrow is automatic or does he need to do it manually. Pt also wants to know if he needs to be close to the device if its automatic

## 2021-11-11 NOTE — Patient Instructions (Signed)
Medication Instructions:  Your physician recommends that you continue on your current medications as directed. Please refer to the Current Medication list given to you today.   *If you need a refill on your cardiac medications before your next appointment, please call your pharmacy*  Lab Work: NONE ordered at this time of appointment   If you have labs (blood work) drawn today and your tests are completely normal, you will receive your results only by: MyChart Message (if you have MyChart) OR A paper copy in the mail If you have any lab test that is abnormal or we need to change your treatment, we will call you to review the results.  Testing/Procedures: NONE ordered at this time of appointment   Follow-Up: At [facility], you and your health needs are our priority.  As part of our continuing mission to provide you with exceptional heart care, we have created designated Provider Care Teams.  These Care Teams include your primary Cardiologist (physician) and Advanced Practice Providers (APPs -  Physician Assistants and Nurse Practitioners) who all work together to provide you with the care you need, when you need it.  Your next appointment:   6 month(s)  The format for your next appointment:   In Person  Provider:   Gayatri A Acharya, MD     Other Instructions  Important Information About Sugar       

## 2021-11-11 NOTE — Progress Notes (Unsigned)
Cardiology Office Note:    Date:  11/13/2021   ID:  William Daniels, DOB 1940/08/10, MRN [id number]  PCP:  Kelton Pillar, [place] [facility] Cardiologist:  Elouise Munroe, MD Electrophysiologist:  Vickie Epley, MD     Referring MD: Kelton Pillar, MD   Chief Complaint  Patient presents with   Follow-up    1 month. Seen for Dr. Margaretann Loveless    History of Present Illness:    William Daniels is a 81 y.o. male with a hx of atrial fibrillation, [employer] dual-chamber pacemaker implant 08/12/2021, hypertension and hyperlipidemia.  Patient was admitted in the hospital with chest pain in July 2023 and found to be in new A-fib with RVR.  He was started on beta-blocker that had a 13.2-second conversion pause with loss of consciousness.  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  Blood pressure was low on follow-up with A-fib clinic on 09/18/2021 at 84/50.  Limited echocardiogram performed on 10/11/2021 showed EF 60 to 65%, no regional wall motion abnormality, mild LV hypertrophy, mild to moderate MR, moderate TR, borderline dilated ascending aorta at 39 mm.  Coronary CT obtained on 10/23/2021 showed 24 to 49% plaque in proximal LAD, less than 24% plaque in the OM 2, coronary calcium score 166 which placed the patient in the 37 percentile for age and sex matched control.  Patient presents today follow-up with wife.  Blood pressure was initially low.  On manual recheck, blood pressure was 108/60.  He denies any significant dizziness or feeling of passing out.  The only blood pressure medication he is currently taking is the metoprolol succinate 25 mg daily for rate control.  He has as needed dose of diltiazem which he has not taken.  He denies any recent chest pain.  We discussed the recent echocardiogram and a coronary CT result.  He can  follow-up with Dr. Ainsley Spinner in 6 months.   Past Medical History:  Diagnosis Date   Glaucoma    Hyperlipidemia    Hypertension     Past Surgical History:  Procedure Laterality Date   PACEMAKER IMPLANT N/A 08/12/2021   Procedure: PACEMAKER IMPLANT;  Surgeon: Vickie Epley, MD;  Location: [facility];  Service: Cardiovascular;  Laterality: N/A;    Current Medications: Current Meds  Medication Sig   acetaminophen (TYLENOL) 500 MG tablet Take 1,000 mg by mouth every 6 (six) hours as needed for mild pain.   apixaban (ELIQUIS) 5 MG TABS tablet Take 1 tablet (5 mg total) by mouth 2 (two) times daily.   Ascorbic Acid (VITAMIN C PO) Take 1 tablet by mouth daily.   cetirizine (ZYRTEC) 10 MG tablet Take 10 mg by mouth daily.   Cholecalciferol (VITAMIN D-3 PO) Take 1 capsule by mouth daily.   diltiazem (CARDIZEM) 30 MG tablet Take 1 tablet (30 mg total) by mouth every 6 (six) hours as needed (FOR AFIB EPISODES).   famotidine (PEPCID) 20 MG tablet Take 20 mg by mouth daily.   fenofibrate 160 MG tablet Take 160 mg by mouth daily.   latanoprost (XALATAN) 0.005 % ophthalmic solution Place 1 drop into both eyes every evening.   metoprolol succinate (TOPROL XL) 25 MG 24 hr tablet Take 1 tablet (25 mg total) by mouth daily.   Multiple Vitamins-Minerals (CENTRUM SILVER 50+MEN) TABS Take 1 tablet by mouth daily.  Polyethyl Glycol-Propyl Glycol (SYSTANE HYDRATION PF OP) Place 1 drop into both eyes daily as needed (dry eye).   simvastatin (ZOCOR) 40 MG tablet Take 40 mg by mouth at bedtime.   [DISCONTINUED] aspirin EC 81 MG tablet Take 1 tablet (81 mg total) by mouth daily. Swallow whole.     Allergies:   Patient has no known allergies.   Social History   Socioeconomic History   Marital status: Married    Spouse name: Not on file   Number of children: Not on file   Years of education: Not on file   Highest education level: Not on file  Occupational History   Not on file  Tobacco  Use   Smoking status: Former    Types: Cigarettes   Smokeless tobacco: Never  Substance and Sexual Activity   Alcohol use: Yes    Alcohol/week: 7.0 standard drinks of alcohol    Types: 7 Cans of beer per week   Drug use: Never   Sexual activity: Not on file  Other Topics Concern   Not on file  Social History Narrative   Not on file   Social Determinants of Health   Financial Resource Strain: Not on file  Food Insecurity: Not on file  Transportation Needs: Not on file  Physical Activity: Not on file  Stress: Not on file  Social Connections: Not on file     Family History: The patient's family history includes Hypertension in his father.  ROS:   Please see the history of present illness.     All other systems reviewed and are negative.  EKGs/Labs/Other Studies Reviewed:    The following studies were reviewed today:  Limited echo 10/11/2021 1. Left ventricular ejection fraction, by estimation, is 60 to 65%. The  left ventricle has normal function. The left ventricle has no regional  wall motion abnormalities. There is mild left ventricular hypertrophy.   2. Right ventricular systolic function is normal. The right ventricular  size is normal. There is normal pulmonary artery systolic pressure.   3. The mitral valve is abnormal. Mild to moderate mitral valve  regurgitation. No evidence of mitral stenosis.   4. Tricuspid valve regurgitation is moderate.   5. Aortic dilatation noted. There is borderline dilatation of the  ascending aorta, measuring 39 mm.   6. The inferior vena cava is normal in size with greater than 50%  respiratory variability, suggesting right atrial pressure of 3 mmHg.   Comparison(s): No significant change from prior study.   EKG:  EKG is ordered today.  The ekg ordered today demonstrates normal sinus rhythm, poor R wave progression in the anterior leads.  No significant ST-T wave changes.  Recent Labs: 08/08/2021: B Natriuretic Peptide 24.8;  Hemoglobin 16.2; Platelets 194 08/09/2021: Magnesium 2.2 10/16/2021: BUN 15; Creatinine, Ser 1.27; Potassium 5.4; Sodium 131  Recent Lipid Panel    Component Value Date/Time   CHOL 113 08/10/2021 0205   TRIG 90 08/10/2021 0205   HDL 56 08/10/2021 0205   CHOLHDL 2.0 08/10/2021 0205   VLDL 18 08/10/2021 0205   LDLCALC 39 08/10/2021 0205     Risk Assessment/Calculations:    CHA2DS2-VASc Score = 4   This indicates a 4.8% annual risk of stroke. The patient's score is based upon: CHF History: 0 HTN History: 1 Diabetes History: 1 Stroke History: 0 Vascular Disease History: 0 Age Score: 2 Gender Score: 0          Physical Exam:    VS:  BP 108/60  Pulse 100   Ht 5' 7.5" (1.715 m)   Wt 151 lb (68.5 kg)   BMI 23.30 kg/m         Wt Readings from Last 3 Encounters:  11/11/21 151 lb (68.5 kg)  10/02/21 154 lb 3.2 oz (69.9 kg)  09/18/21 164 lb (74.4 kg)     GEN:  Well nourished, well developed in no acute distress HEENT: Normal NECK: No JVD; No carotid bruits LYMPHATICS: No lymphadenopathy CARDIAC: RRR, no murmurs, rubs, gallops RESPIRATORY:  Clear to auscultation without rales, wheezing or rhonchi  ABDOMEN: Soft, non-tender, non-distended MUSCULOSKELETAL:  No edema; No deformity  SKIN: Warm and dry NEUROLOGIC:  Alert and oriented x 3 PSYCHIATRIC:  Normal affect   ASSESSMENT:    1. PAF (paroxysmal atrial fibrillation) ([place])   2. Coronary artery disease involving native coronary artery of native heart without angina pectoris   3. Cardiac pacemaker in situ   4. Hyperlipidemia LDL goal <70    PLAN:    In order of problems listed above:  PAF: On Eliquis and metoprolol succinate 25 mg daily.  Blood pressure borderline.  Unable to reduce rate control medication further.  CAD: Nonobstructive disease noted on recent coronary CT.  Denies any chest pain  History of pacemaker: Patient have prolonged posttermination pauses that resulted in syncope, a pacemaker was  subsequently placed  Hyperlipidemia: On Zocor           Medication Adjustments/Labs and Tests Ordered: Current medicines are reviewed at length with the patient today.  Concerns regarding medicines are outlined above.  Orders Placed This Encounter  Procedures   EKG 12-Lead   No orders of the defined types were placed in this encounter.   Patient Instructions  Medication Instructions:  Your physician recommends that you continue on your current medications as directed. Please refer to the Current Medication list given to you today.  *If you need a refill on your cardiac medications before your next appointment, please call your pharmacy*  Lab Work: NONE ordered at this time of appointment   If you have labs (blood work) drawn today and your tests are completely normal, you will receive your results only by: [place] (if you have MyChart) OR A paper copy in the mail If you have any lab test that is abnormal or we need to change your treatment, we will call you to review the results.  Testing/Procedures: NONE ordered at this time of appointment   Follow-Up: At [facility], you and your health needs are our priority.  As part of our continuing mission to provide you with exceptional heart care, we have created designated Provider Care Teams.  These Care Teams include your primary Cardiologist (physician) and Advanced Practice Providers (APPs -  Physician Assistants and Nurse Practitioners) who all work together to provide you with the care you need, when you need it.  Your next appointment:   6 month(s)  The format for your next appointment:   In Person  Provider:   Elouise Munroe, MD     Other Instructions  Important Information About Sugar         Signed, Almyra Deforest, [place]  11/13/2021 10:50 PM    [place]

## 2021-11-12 ENCOUNTER — Ambulatory Visit (INDEPENDENT_AMBULATORY_CARE_PROVIDER_SITE_OTHER): Payer: Medicare PPO

## 2021-11-12 DIAGNOSIS — I495 Sick sinus syndrome: Secondary | ICD-10-CM | POA: Diagnosis not present

## 2021-11-12 NOTE — Telephone Encounter (Signed)
I spoke with the patient and let him know that the monitor is automatic and he do not need to do anything.

## 2021-11-13 ENCOUNTER — Encounter: Payer: Self-pay | Admitting: Physician Assistant

## 2021-11-14 LAB — CUP PACEART REMOTE DEVICE CHECK
Battery Remaining Longevity: 180 mo
Battery Remaining Percentage: 100 %
Brady Statistic RA Percent Paced: 4 %
Brady Statistic RV Percent Paced: 1 %
Date Time Interrogation Session: 20231024043100
Implantable Lead Connection Status: 753985
Implantable Lead Connection Status: 753985
Implantable Lead Implant Date: 20230724
Implantable Lead Implant Date: 20230724
Implantable Lead Location: 753859
Implantable Lead Location: 753860
Implantable Lead Model: 7840
Implantable Lead Model: 7841
Implantable Lead Serial Number: 1061671
Implantable Lead Serial Number: 1297615
Implantable Pulse Generator Implant Date: 20230724
Lead Channel Impedance Value: 570 Ohm
Lead Channel Impedance Value: 670 Ohm
Lead Channel Pacing Threshold Amplitude: 0.7 V
Lead Channel Pacing Threshold Amplitude: 1 V
Lead Channel Pacing Threshold Pulse Width: 0.4 ms
Lead Channel Pacing Threshold Pulse Width: 0.4 ms
Lead Channel Setting Pacing Amplitude: 3.5 V
Lead Channel Setting Pacing Amplitude: 3.5 V
Lead Channel Setting Pacing Pulse Width: 0.4 ms
Lead Channel Setting Sensing Sensitivity: 2.5 mV
Pulse Gen Serial Number: 597897
Zone Setting Status: 755011

## 2021-11-15 DIAGNOSIS — I4891 Unspecified atrial fibrillation: Secondary | ICD-10-CM | POA: Diagnosis not present

## 2021-11-15 DIAGNOSIS — E782 Mixed hyperlipidemia: Secondary | ICD-10-CM | POA: Diagnosis not present

## 2021-11-15 DIAGNOSIS — Z0000 Encounter for general adult medical examination without abnormal findings: Secondary | ICD-10-CM | POA: Diagnosis not present

## 2021-11-15 DIAGNOSIS — M859 Disorder of bone density and structure, unspecified: Secondary | ICD-10-CM | POA: Diagnosis not present

## 2021-11-15 DIAGNOSIS — D692 Other nonthrombocytopenic purpura: Secondary | ICD-10-CM | POA: Diagnosis not present

## 2021-11-15 DIAGNOSIS — R059 Cough, unspecified: Secondary | ICD-10-CM | POA: Diagnosis not present

## 2021-11-15 DIAGNOSIS — M315 Giant cell arteritis with polymyalgia rheumatica: Secondary | ICD-10-CM | POA: Diagnosis not present

## 2021-11-15 DIAGNOSIS — M353 Polymyalgia rheumatica: Secondary | ICD-10-CM | POA: Diagnosis not present

## 2021-11-15 DIAGNOSIS — E1122 Type 2 diabetes mellitus with diabetic chronic kidney disease: Secondary | ICD-10-CM | POA: Diagnosis not present

## 2021-11-18 ENCOUNTER — Other Ambulatory Visit: Payer: Self-pay | Admitting: Internal Medicine

## 2021-11-19 ENCOUNTER — Encounter: Payer: Self-pay | Admitting: Cardiology

## 2021-11-19 ENCOUNTER — Ambulatory Visit: Payer: Medicare PPO | Attending: Cardiology | Admitting: Cardiology

## 2021-11-19 VITALS — BP 110/78 | HR 85 | Ht 67.5 in | Wt 151.4 lb

## 2021-11-19 DIAGNOSIS — Z950 Presence of cardiac pacemaker: Secondary | ICD-10-CM

## 2021-11-19 DIAGNOSIS — I495 Sick sinus syndrome: Secondary | ICD-10-CM

## 2021-11-19 DIAGNOSIS — I480 Paroxysmal atrial fibrillation: Secondary | ICD-10-CM

## 2021-11-19 NOTE — Progress Notes (Signed)
Electrophysiology Office Follow up Visit Note:    Date:  11/19/2021   ID:  William Daniels, DOB [birth year], MRN [id number]  PCP:  Collene Leyden, MD  [facility] Cardiologist:  Elouise Munroe, MD  [facility] Electrophysiologist:  Vickie Epley, MD    Interval History:    William Daniels is a 81 y.o. male who presents for a follow up visit.  He had a pacemaker implanted August 12, 2021 for symptomatic bradycardia.  He has done well since device implant.  Today, he presents recovering from pneumonia. He is still coughing but overall has been recovering well. He has been checking his blood pressure in the morning and reports his blood pressure being highest in the mornings. He is taking his metoprolol in the mornings.   He denies any palpitations, chest pain, shortness of breath, or peripheral edema. No lightheadedness, headaches, syncope, orthopnea, or PND.      Past Medical History:  Diagnosis Date   Glaucoma    Hyperlipidemia    Hypertension     Past Surgical History:  Procedure Laterality Date   PACEMAKER IMPLANT N/A 08/12/2021   Procedure: PACEMAKER IMPLANT;  Surgeon: Vickie Epley, MD;  Location: [facility];  Service: Cardiovascular;  Laterality: N/A;    Current Medications: Current Meds  Medication Sig   acetaminophen (TYLENOL) 500 MG tablet Take 1,000 mg by mouth every 6 (six) hours as needed for mild pain.   apixaban (ELIQUIS) 5 MG TABS tablet Take 1 tablet (5 mg total) by mouth 2 (two) times daily.   Ascorbic Acid (VITAMIN C PO) Take 1 tablet by mouth daily.   cetirizine (ZYRTEC) 10 MG tablet Take 10 mg by mouth daily.   Cholecalciferol (VITAMIN D-3 PO) Take 1 capsule by mouth daily.   diltiazem (CARDIZEM) 30 MG tablet Take 1 tablet (30 mg total) by mouth every 6 (six) hours as needed (FOR AFIB EPISODES).   famotidine (PEPCID) 20 MG tablet Take 20 mg by mouth daily.   fenofibrate 160 MG tablet Take 160 mg by mouth daily.   latanoprost (XALATAN)  0.005 % ophthalmic solution Place 1 drop into both eyes every evening.   metoprolol succinate (TOPROL XL) 25 MG 24 hr tablet Take 1 tablet (25 mg total) by mouth daily.   Multiple Vitamins-Minerals (CENTRUM SILVER 50+MEN) TABS Take 1 tablet by mouth daily.   Polyethyl Glycol-Propyl Glycol (SYSTANE HYDRATION PF OP) Place 1 drop into both eyes daily as needed (dry eye).   simvastatin (ZOCOR) 40 MG tablet Take 40 mg by mouth at bedtime.     Allergies:   Patient has no known allergies.   Social History   Socioeconomic History   Marital status: Married    Spouse name: Not on file   Number of children: Not on file   Years of education: Not on file   Highest education level: Not on file  Occupational History   Not on file  Tobacco Use   Smoking status: Former    Types: Cigarettes   Smokeless tobacco: Never  Substance and Sexual Activity   Alcohol use: Yes    Alcohol/week: 7.0 standard drinks of alcohol    Types: 7 Cans of beer per week   Drug use: Never   Sexual activity: Not on file  Other Topics Concern   Not on file  Social History Narrative   Not on file   Social Determinants of Health   Financial Resource Strain: Not on file  Food Insecurity: Not on  file  Transportation Needs: Not on file  Physical Activity: Not on file  Stress: Not on file  Social Connections: Not on file     Family History: The patient's family history includes Hypertension in his father.  ROS:   Please see the history of present illness.    (+) Coughing All other systems reviewed and are negative.  EKGs/Labs/Other Studies Reviewed:    The following studies were reviewed today:  November 19, 2021 in clinic device interrogation personally reviewed Battery 15 years Lead parameters stable 8% AF/AT/AFL 4% Ap <1% Vp   EKG:   11/19/2021: The ekg ordered today demonstrates sinus rhythm  Recent Labs: 08/08/2021: B Natriuretic Peptide 24.8; Hemoglobin 16.2; Platelets 194 08/09/2021: Magnesium  2.2 10/16/2021: BUN 15; Creatinine, Ser 1.27; Potassium 5.4; Sodium 131  Recent Lipid Panel    Component Value Date/Time   CHOL 113 08/10/2021 0205   TRIG 90 08/10/2021 0205   HDL 56 08/10/2021 0205   CHOLHDL 2.0 08/10/2021 0205   VLDL 18 08/10/2021 0205   LDLCALC 39 08/10/2021 0205    Physical Exam:    VS:  BP 110/78   Pulse 85   Ht 5' 7.5" (1.715 m)   Wt 151 lb 6.4 oz (68.7 kg)   SpO2 97%   BMI 23.36 kg/m     Wt Readings from Last 3 Encounters:  11/19/21 151 lb 6.4 oz (68.7 kg)  11/11/21 151 lb (68.5 kg)  10/02/21 154 lb 3.2 oz (69.9 kg)     GEN: Well nourished, well developed in no acute distress HEENT: Normal NECK: No JVD; No carotid bruits LYMPHATICS: No lymphadenopathy CARDIAC: RRR, no murmurs, rubs, gallops. PPM site well healed. RESPIRATORY:  Clear to auscultation without rales, wheezing or rhonchi  ABDOMEN: Soft, non-tender, non-distended MUSCULOSKELETAL:  No edema; No deformity  SKIN: Warm and dry NEUROLOGIC:  Alert and oriented x 3 PSYCHIATRIC:  Normal affect        ASSESSMENT:    1. Sick sinus syndrome (HCC)   2. Cardiac pacemaker in situ   3. PAF (paroxysmal atrial fibrillation) (HCC)    PLAN:    In order of problems listed above:  #Sick sinus syndrome #Permanent pacemaker in situ Device functioning appropriately.  Continue remote monitoring.  #AF/AFL Low burden, 8% on recent device interrogation - continue eliquis   Follow-up with APP in 1 year.     Medication Adjustments/Labs and Tests Ordered: Current medicines are reviewed at length with the patient today.  Concerns regarding medicines are outlined above.  No orders of the defined types were placed in this encounter.  No orders of the defined types were placed in this encounter.   I,Rachel Rivera,acting as a scribe for Vickie Epley, MD.,have documented all relevant documentation on the behalf of Vickie Epley, MD,as directed by  Vickie Epley, MD while in the  presence of Vickie Epley, MD.  I, Vickie Epley, MD, have reviewed all documentation for this visit. The documentation on 11/19/21 for the exam, diagnosis, procedures, and orders are all accurate and complete.    Signed, Lars Mage, MD, [facility], [facility] 11/19/2021 3:46 PM    Electrophysiology [facility]

## 2021-11-19 NOTE — Patient Instructions (Signed)
Medication Instructions:  None  *If you need a refill on your cardiac medications before your next appointment, please call your pharmacy*   Lab Work: none If you have labs (blood work) drawn today and your tests are completely normal, you will receive your results only by: [place] (if you have MyChart) OR A paper copy in the mail If you have any lab test that is abnormal or we need to change your treatment, we will call you to review the results.   Testing/Procedures: none   Follow-Up: At [facility], you and your health needs are our priority.  As part of our continuing mission to provide you with exceptional heart care, we have created designated Provider Care Teams.  These Care Teams include your primary Cardiologist (physician) and Advanced Practice Providers (APPs -  Physician Assistants and Nurse Practitioners) who all work together to provide you with the care you need, when you need it.  We recommend signing up for the patient portal called "MyChart".  Sign up information is provided on this After Visit Summary.  MyChart is used to connect with patients for Virtual Visits (Telemedicine).  Patients are able to view lab/test results, encounter notes, upcoming appointments, etc.  Non-urgent messages can be sent to your provider as well.   To learn more about what you can do with MyChart, go to [email or web address].    Your next appointment:   1 year(s)  The format for your next appointment:   In Person  Provider:   You will see one of the following Advanced Practice Providers on your designated Care Team:   Tommye Standard, [place] Legrand Como "Jonni Sanger" Chalmers Cater, [place]      Other Instructions None   Important Information About Sugar

## 2021-11-19 NOTE — Progress Notes (Deleted)
Electrophysiology Office Follow up Visit Note:    Date:  11/19/2021   ID:  William Daniels, DOB [birth year], MRN [id number]  PCP:  Kelton Pillar, MD  [facility] Cardiologist:  Elouise Munroe, MD  [facility] Electrophysiologist:  Vickie Epley, MD    Interval History:    William Daniels is a 81 y.o. male who presents for a follow up visit.  He had a pacemaker implanted August 12, 2021 for symptomatic bradycardia.  He has done well since device implant.       Past Medical History:  Diagnosis Date   Glaucoma    Hyperlipidemia    Hypertension     Past Surgical History:  Procedure Laterality Date   PACEMAKER IMPLANT N/A 08/12/2021   Procedure: PACEMAKER IMPLANT;  Surgeon: Vickie Epley, MD;  Location: [place] [facility];  Service: Cardiovascular;  Laterality: N/A;    Current Medications: No outpatient medications have been marked as taking for the 11/19/21 encounter (Appointment) with Vickie Epley, MD.     Allergies:   Patient has no known allergies.   Social History   Socioeconomic History   Marital status: Married    Spouse name: Not on file   Number of children: Not on file   Years of education: Not on file   Highest education level: Not on file  Occupational History   Not on file  Tobacco Use   Smoking status: Former    Types: Cigarettes   Smokeless tobacco: Never  Substance and Sexual Activity   Alcohol use: Yes    Alcohol/week: 7.0 standard drinks of alcohol    Types: 7 Cans of beer per week   Drug use: Never   Sexual activity: Not on file  Other Topics Concern   Not on file  Social History Narrative   Not on file   Social Determinants of Health   Financial Resource Strain: Not on file  Food Insecurity: Not on file  Transportation Needs: Not on file  Physical Activity: Not on file  Stress: Not on file  Social Connections: Not on file     Family History: The patient's family history includes Hypertension in his  father.  ROS:   Please see the history of present illness.    All other systems reviewed and are negative.  EKGs/Labs/Other Studies Reviewed:    The following studies were reviewed today:  November 19, 2021 in clinic device interrogation personally reviewed ***  EKG:  The ekg ordered today demonstrates ***  Recent Labs: 08/08/2021: B Natriuretic Peptide 24.8; Hemoglobin 16.2; Platelets 194 08/09/2021: Magnesium 2.2 10/16/2021: BUN 15; Creatinine, Ser 1.27; Potassium 5.4; Sodium 131  Recent Lipid Panel    Component Value Date/Time   CHOL 113 08/10/2021 0205   TRIG 90 08/10/2021 0205   HDL 56 08/10/2021 0205   CHOLHDL 2.0 08/10/2021 0205   VLDL 18 08/10/2021 0205   LDLCALC 39 08/10/2021 0205    Physical Exam:    VS:  There were no vitals taken for this visit.    Wt Readings from Last 3 Encounters:  11/11/21 151 lb (68.5 kg)  10/02/21 154 lb 3.2 oz (69.9 kg)  09/18/21 164 lb (74.4 kg)     GEN: *** Well nourished, well developed in no acute distress HEENT: Normal NECK: No JVD; No carotid bruits LYMPHATICS: No lymphadenopathy CARDIAC: ***RRR, no murmurs, rubs, gallops RESPIRATORY:  Clear to auscultation without rales, wheezing or rhonchi  ABDOMEN: Soft, non-tender, non-distended MUSCULOSKELETAL:  No edema; No deformity  SKIN: Warm and dry NEUROLOGIC:  Alert and oriented x 3 PSYCHIATRIC:  Normal affect        ASSESSMENT:    1. Sick sinus syndrome (HCC)   2. Cardiac pacemaker in situ   3. PAF (paroxysmal atrial fibrillation) (HCC)    PLAN:    In order of problems listed above:  #Sick sinus syndrome #Permanent pacemaker in situ Device functioning appropriately.  Continue remote monitoring.  Follow-up with APP in 1 year.   Total time spent with patient today *** minutes. This includes reviewing records, evaluating the patient and coordinating care.   Medication Adjustments/Labs and Tests Ordered: Current medicines are reviewed at length with the patient  today.  Concerns regarding medicines are outlined above.  No orders of the defined types were placed in this encounter.  No orders of the defined types were placed in this encounter.    Signed, Lars Mage, MD, [facility], [facility] 11/19/2021 5:42 AM    Electrophysiology [facility]

## 2021-12-02 NOTE — Progress Notes (Signed)
Remote pacemaker transmission.   

## 2022-01-16 DIAGNOSIS — H401131 Primary open-angle glaucoma, bilateral, mild stage: Secondary | ICD-10-CM | POA: Diagnosis not present

## 2022-01-28 ENCOUNTER — Telehealth: Payer: Self-pay | Admitting: Internal Medicine

## 2022-01-28 NOTE — Telephone Encounter (Signed)
I s/w the DDS to get fax #. Fax# [phone], I will fax clearance to requesting office.

## 2022-01-28 NOTE — Telephone Encounter (Signed)
    Primary Cardiologist: Elouise Munroe, MD  Chart reviewed as part of pre-operative protocol coverage. Simple dental extractions are considered low risk procedures per guidelines and generally do not require any specific cardiac clearance. It is also generally accepted that for simple extractions and dental cleanings, there is no need to interrupt blood thinner therapy.   SBE prophylaxis is not required for the patient.  I will route this recommendation to the requesting party via Epic fax function and remove from pre-op pool.  Please call with questions.  Elgie Collard, PA-C 01/28/2022, 11:21 AM

## 2022-01-28 NOTE — Telephone Encounter (Signed)
   Pre-operative Risk Assessment    Patient Name: William Daniels  DOB: [birth year] MRN: [id number]     Request for Surgical Clearance    Procedure:   Routine dental cleaning   Date of Surgery:  Clearance 02/06/22                                 Surgeon:  Chaya Jan, DDS Surgeon's Group or Practice Name:  [place] Phone number:  [phone] Fax number:  Unknown    Type of Clearance Requested:   - Pharmacy:  Hold Apixaban (Eliquis) TBD by cardiology    Type of Anesthesia:  None    Additional requests/questions:    [name]   01/28/2022, 11:10 AM

## 2022-01-30 ENCOUNTER — Encounter: Payer: Self-pay | Admitting: Internal Medicine

## 2022-02-11 ENCOUNTER — Other Ambulatory Visit: Payer: Self-pay | Admitting: Physician Assistant

## 2022-02-11 ENCOUNTER — Ambulatory Visit: Payer: Medicare PPO | Attending: Cardiology

## 2022-02-11 DIAGNOSIS — I495 Sick sinus syndrome: Secondary | ICD-10-CM

## 2022-02-11 LAB — CUP PACEART REMOTE DEVICE CHECK
Battery Remaining Longevity: 180 mo
Battery Remaining Percentage: 100 %
Brady Statistic RA Percent Paced: 1 %
Brady Statistic RV Percent Paced: 0 %
Date Time Interrogation Session: 20240123043100
Implantable Lead Connection Status: 753985
Implantable Lead Connection Status: 753985
Implantable Lead Implant Date: 20230724
Implantable Lead Implant Date: 20230724
Implantable Lead Location: 753859
Implantable Lead Location: 753860
Implantable Lead Model: 7840
Implantable Lead Model: 7841
Implantable Lead Serial Number: 1061671
Implantable Lead Serial Number: 1297615
Implantable Pulse Generator Implant Date: 20230724
Lead Channel Impedance Value: 581 Ohm
Lead Channel Impedance Value: 677 Ohm
Lead Channel Pacing Threshold Amplitude: 0.7 V
Lead Channel Pacing Threshold Amplitude: 1.1 V
Lead Channel Pacing Threshold Pulse Width: 0.4 ms
Lead Channel Pacing Threshold Pulse Width: 0.4 ms
Lead Channel Setting Pacing Amplitude: 2 V
Lead Channel Setting Pacing Amplitude: 2.5 V
Lead Channel Setting Pacing Pulse Width: 0.4 ms
Lead Channel Setting Sensing Sensitivity: 2.5 mV
Pulse Gen Serial Number: 597897
Zone Setting Status: 755011

## 2022-02-27 ENCOUNTER — Encounter (HOSPITAL_COMMUNITY): Payer: Self-pay | Admitting: *Deleted

## 2022-03-11 NOTE — Progress Notes (Signed)
Remote pacemaker transmission.   

## 2022-04-29 DIAGNOSIS — D225 Melanocytic nevi of trunk: Secondary | ICD-10-CM | POA: Diagnosis not present

## 2022-04-29 DIAGNOSIS — L820 Inflamed seborrheic keratosis: Secondary | ICD-10-CM | POA: Diagnosis not present

## 2022-04-29 DIAGNOSIS — L570 Actinic keratosis: Secondary | ICD-10-CM | POA: Diagnosis not present

## 2022-04-29 DIAGNOSIS — L578 Other skin changes due to chronic exposure to nonionizing radiation: Secondary | ICD-10-CM | POA: Diagnosis not present

## 2022-04-29 DIAGNOSIS — Z85828 Personal history of other malignant neoplasm of skin: Secondary | ICD-10-CM | POA: Diagnosis not present

## 2022-05-13 ENCOUNTER — Ambulatory Visit (INDEPENDENT_AMBULATORY_CARE_PROVIDER_SITE_OTHER): Payer: Medicare PPO

## 2022-05-13 DIAGNOSIS — I495 Sick sinus syndrome: Secondary | ICD-10-CM | POA: Diagnosis not present

## 2022-05-14 LAB — CUP PACEART REMOTE DEVICE CHECK
Battery Remaining Longevity: 180 mo
Battery Remaining Percentage: 100 %
Brady Statistic RA Percent Paced: 2 %
Brady Statistic RV Percent Paced: 0 %
Date Time Interrogation Session: 20240423043100
Implantable Lead Connection Status: 753985
Implantable Lead Connection Status: 753985
Implantable Lead Implant Date: 20230724
Implantable Lead Implant Date: 20230724
Implantable Lead Location: 753859
Implantable Lead Location: 753860
Implantable Lead Model: 7840
Implantable Lead Model: 7841
Implantable Lead Serial Number: 1061671
Implantable Lead Serial Number: 1297615
Implantable Pulse Generator Implant Date: 20230724
Lead Channel Impedance Value: 601 Ohm
Lead Channel Impedance Value: 678 Ohm
Lead Channel Pacing Threshold Amplitude: 0.7 V
Lead Channel Pacing Threshold Amplitude: 1 V
Lead Channel Pacing Threshold Pulse Width: 0.4 ms
Lead Channel Pacing Threshold Pulse Width: 0.4 ms
Lead Channel Setting Pacing Amplitude: 2 V
Lead Channel Setting Pacing Amplitude: 2.5 V
Lead Channel Setting Pacing Pulse Width: 0.4 ms
Lead Channel Setting Sensing Sensitivity: 2.5 mV
Pulse Gen Serial Number: 597897
Zone Setting Status: 755011

## 2022-05-15 ENCOUNTER — Other Ambulatory Visit: Payer: Self-pay | Admitting: Family Medicine

## 2022-05-15 ENCOUNTER — Ambulatory Visit
Admission: RE | Admit: 2022-05-15 | Discharge: 2022-05-15 | Disposition: A | Discharge status: Home / Self Care | Payer: Medicare PPO | Source: Ambulatory Visit | Attending: Family Medicine | Admitting: Family Medicine

## 2022-05-15 DIAGNOSIS — M8588 Other specified disorders of bone density and structure, other site: Secondary | ICD-10-CM | POA: Diagnosis not present

## 2022-05-15 DIAGNOSIS — E1122 Type 2 diabetes mellitus with diabetic chronic kidney disease: Secondary | ICD-10-CM | POA: Diagnosis not present

## 2022-05-15 DIAGNOSIS — J90 Pleural effusion, not elsewhere classified: Secondary | ICD-10-CM | POA: Diagnosis not present

## 2022-05-15 DIAGNOSIS — M315 Giant cell arteritis with polymyalgia rheumatica: Secondary | ICD-10-CM | POA: Diagnosis not present

## 2022-05-15 DIAGNOSIS — K219 Gastro-esophageal reflux disease without esophagitis: Secondary | ICD-10-CM | POA: Diagnosis not present

## 2022-05-15 DIAGNOSIS — I4891 Unspecified atrial fibrillation: Secondary | ICD-10-CM | POA: Diagnosis not present

## 2022-05-15 DIAGNOSIS — E782 Mixed hyperlipidemia: Secondary | ICD-10-CM | POA: Diagnosis not present

## 2022-05-15 DIAGNOSIS — M353 Polymyalgia rheumatica: Secondary | ICD-10-CM | POA: Diagnosis not present

## 2022-05-15 DIAGNOSIS — N183 Chronic kidney disease, stage 3 unspecified: Secondary | ICD-10-CM | POA: Diagnosis not present

## 2022-05-15 DIAGNOSIS — I700 Atherosclerosis of aorta: Secondary | ICD-10-CM | POA: Diagnosis not present

## 2022-05-15 LAB — LAB REPORT - SCANNED
A1c: 5.9
Creatinine, POC: 53 mg/dL
EGFR: 55
Microalb Creat Ratio: 13.3
Microalbumin, Urine: 0.7

## 2022-05-20 NOTE — Progress Notes (Signed)
Cardiology Office Note:    Date:  05/21/2022   ID:  William Daniels, DOB [birth year], MRN [id number]  PCP:  Irven Coe, MD  Cardiologist:  Parke Poisson, MD  Electrophysiologist:  Lanier Prude, MD   Referring MD: Irven Coe, MD   Chief Complaint/Reason for Referral: CC: follow-up  History of Present Illness:    William Daniels is a 82 y.o. male with a history of afib, HTN and HLD here for follow up after pacemaker implant.  Today: Feels well overall.  Occasional episodes of atrial fibrillation, he feels the last 1 was in December and is documented on his pacemaker report from February 11, 2022.  13 hours of atrial fibrillation.  He notes his heart rate goes up to about 150 beats a minute and he inquired about dosing for as needed diltiazem which we have discussed in detail.  He continues on his other medical therapy without issue.  Pacemaker well-functioning with minimal pacing and low burden of atrial fibrillation.  Prior to his pacemaker implant, he had chest discomfort with atrial fibrillation rapid rate. Stable device interrogations. No significant recurrence of afib. Continues on DOAC.   He has had a cough since he left the hospital. It is sometimes accompanied by a mild fever of 99.7. It has been getting worse every day. He took an antibiotic course, and had CXRs before and after. A doctor noted that there was improvement on the second CXR, but there is a small amount of fluid in the lungs.  This morning he felt dizzy and had a presyncopal episode. We discussed obtaining an echo given concerning symptoms.   He quit smoking 51 years ago. He takes an Asprin every day.  The patient denies dyspnea at rest or with exertion, palpitations, PND, orthopnea, or leg swelling. Denies chills. Denies nausea, vomiting. Denies syncope. Denies snoring.  Past Medical History:  Diagnosis Date   Glaucoma    Hyperlipidemia    Hypertension     Past Surgical History:  Procedure Laterality  Date   PACEMAKER IMPLANT N/A 08/12/2021   Procedure: PACEMAKER IMPLANT;  Surgeon: Lanier Prude, MD;  Location: [facility];  Service: Cardiovascular;  Laterality: N/A;    Current Medications: Current Meds  Medication Sig   acetaminophen (TYLENOL) 500 MG tablet Take 1,000 mg by mouth every 6 (six) hours as needed for mild pain.   apixaban (ELIQUIS) 5 MG TABS tablet Take 1 tablet (5 mg total) by mouth 2 (two) times daily.   Ascorbic Acid (VITAMIN C PO) Take 1 tablet by mouth daily.   cetirizine (ZYRTEC) 10 MG tablet Take 10 mg by mouth daily.   Cholecalciferol (VITAMIN D-3 PO) Take 1 capsule by mouth daily.   diltiazem (CARDIZEM) 30 MG tablet Take 1 tablet (30 mg total) by mouth every 6 (six) hours as needed (FOR AFIB EPISODES).   famotidine (PEPCID) 20 MG tablet Take 20 mg by mouth daily.   fenofibrate 160 MG tablet Take 160 mg by mouth daily.   latanoprost (XALATAN) 0.005 % ophthalmic solution Place 1 drop into both eyes every evening.   metoprolol succinate (TOPROL-XL) 25 MG 24 hr tablet TAKE 1 TABLET BY MOUTH DAILY   Multiple Vitamins-Minerals (CENTRUM SILVER 50+MEN) TABS Take 1 tablet by mouth daily.   Polyethyl Glycol-Propyl Glycol (SYSTANE HYDRATION PF OP) Place 1 drop into both eyes daily as needed (dry eye).   simvastatin (ZOCOR) 40 MG tablet Take 40 mg by mouth at bedtime.     Allergies:  Patient has no known allergies.   Social History   Tobacco Use   Smoking status: Former    Types: Cigarettes   Smokeless tobacco: Never  Substance Use Topics   Alcohol use: Yes    Alcohol/week: 7.0 standard drinks of alcohol    Types: 7 Cans of beer per week   Drug use: Never     Family History: The patient's family history includes Hypertension in his father.  ROS:   Please see the history of present illness.     All other systems reviewed and are negative.  EKGs/Labs/Other Studies Reviewed:    The following studies were reviewed today:  Pacemaker Implant  08/12/21:  CONCLUSIONS:   1.  Symptomatic sinus node dysfunction  2.  Successful dual chamber permanent pacemaker implantation  3.  No early apparent complications.   4.  Hold anticoagulation for 5 days.  Okay to restart August 18, 2021.  Echo 08/09/21:  1. Left ventricular ejection fraction, by estimation, is 60 to 65%. The  left ventricle has normal function. The left ventricle has no regional wall motion abnormalities. Left ventricular diastolic parameters are indeterminate.   2. Right ventricular systolic function is mildly reduced. The right ventricular size is mildly enlarged. There is normal pulmonary artery  systolic pressure.   3. The mitral valve is abnormal. Mild mitral valve regurgitation. No evidence of mitral stenosis.   4. Tricuspid valve regurgitation is moderate.   5. The aortic valve is tricuspid. There is mild calcification of the aortic valve. There is mild thickening of the aortic valve. Aortic valve regurgitation is not visualized. Aortic valve sclerosis is present, with no evidence of aortic valve stenosis.   6. Aortic dilatation noted. 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   7. The inferior vena cava is normal in size with greater than 50% respiratory variability, suggesting right atrial pressure of 3 mmHg.   EKG: EKG is personally reviewed 05/21/22: NSR, irbbb, septal infarct pattern 10/02/21: NSR with PACs. Septal infarct pattern.   Recent Labs: 08/08/2021: B Natriuretic Peptide 24.8; Hemoglobin 16.2; Platelets 194 08/09/2021: Magnesium 2.2 10/16/2021: BUN 15; Creatinine, Ser 1.27; Potassium 5.4; Sodium 131   Recent Lipid Panel    Component Value Date/Time   CHOL 113 08/10/2021 0205   TRIG 90 08/10/2021 0205   HDL 56 08/10/2021 0205   CHOLHDL 2.0 08/10/2021 0205   VLDL 18 08/10/2021 0205   LDLCALC 39 08/10/2021 0205    Physical Exam:    VS:  BP 114/84 (BP Location: Right  Arm, Patient Position: Lying right side, Cuff Size: Normal)   Pulse 83   Ht 5\' 8"  (1.727 m)   Wt 161 lb 6.4 oz (73.2 kg)   SpO2 97%   BMI 24.54 kg/m     Wt Readings from Last 5 Encounters:  05/21/22 161 lb 6.4 oz (73.2 kg)  11/19/21 151 lb 6.4 oz (68.7 kg)  11/11/21 151 lb (68.5 kg)  10/02/21 154 lb 3.2 oz (69.9 kg)  09/18/21 164 lb (74.4 kg)    Constitutional: No acute distress Eyes: sclera non-icteric, normal conjunctiva and lids ENMT: normal dentition, moist mucous membranes Cardiovascular: regular rhythm, normal rate, no murmur. S1 and S2 normal. No jugular venous distention.  Respiratory: clear to auscultation bilaterally GI : normal bowel sounds, soft and nontender. No distention.   MSK: extremities warm, well perfused. No edema.  NEURO: grossly nonfocal exam, moves all extremities.  PSYCH: alert and oriented x 3, normal mood and affect.   ASSESSMENT:    1. PAF (paroxysmal atrial fibrillation) (HCC)     PLAN:    Paroxysmal atrial fibrillation (HCC) - Plan: EKG 12-Lead Secondary hypercoagulable state (HCC) - continue eliquis 5 mg BID. On metoprolol succinate 25 mg daily with PPM in place. CHA2DS2-VASc Score = 4  The patient's score is based upon: CHF History: 0 HTN History: 1 Diabetes History: 1 Stroke History: 0 Vascular Disease History: 0 Age Score: 2 Gender Score: 0  Precordial pain -  - CCTA with mild CAD. -echocardiogram unremarkable for chest pain.   Cardiac pacemaker in situ - very long conversion pause with SSS and now with PPM, stable device pocket and function per EP.  Primary hypertension - bp well controlled , cont metoprolol.   Total time of encounter: 40 minutes total time of encounter, including 30 minutes spent in face-to-face patient care on the date of this encounter. This time includes coordination of care and counseling regarding above mentioned problem list. Remainder of non-face-to-face time involved reviewing chart documents/testing  relevant to the patient encounter and documentation in the medical record. I have independently reviewed documentation from referring provider.   Weston Brass, MD, [facility]  [facility]   Shared Decision Making/Informed Consent:       Medication Adjustments/Labs and Tests Ordered: Current medicines are reviewed at length with the patient today.  Concerns regarding medicines are outlined above.   Orders Placed This Encounter  Procedures   EKG 12-Lead    No orders of the defined types were placed in this encounter.   Patient Instructions  Medication Instructions:  No Changes In Medications at this time.   *If you need a refill on your cardiac medications before your next appointment, please call your pharmacy*  Lab Work: None Ordered At This Time.   If you have labs (blood work) drawn today and your tests are completely normal, you will receive your results only by: MyChart Message (if you have MyChart) OR A paper copy in the mail If you have any lab test that is abnormal or we need to change your treatment, we will call you to review the results.  Testing/Procedures: None Ordered At This Time.    Follow-Up: At [facility], you and your health needs are our priority.  As part of our continuing mission to provide you with exceptional heart care, we have created designated Provider Care Teams.  These Care Teams include your primary Cardiologist (physician) and Advanced Practice Providers (APPs -  Physician Assistants and Nurse Practitioners) who all work together to provide you with the care you need, when you need it.  Your next appointment:   1 year(s)  Provider:   Parke Poisson, MD

## 2022-05-21 ENCOUNTER — Ambulatory Visit: Payer: Medicare PPO | Attending: Internal Medicine | Admitting: Internal Medicine

## 2022-05-21 ENCOUNTER — Encounter: Payer: Self-pay | Admitting: Internal Medicine

## 2022-05-21 VITALS — BP 114/84 | HR 83 | Ht 68.0 in | Wt 161.4 lb

## 2022-05-21 DIAGNOSIS — I480 Paroxysmal atrial fibrillation: Secondary | ICD-10-CM | POA: Diagnosis not present

## 2022-05-21 NOTE — Patient Instructions (Signed)
Medication Instructions:  No Changes In Medications at this time.  *If you need a refill on your cardiac medications before your next appointment, please call your pharmacy*  Lab Work: None Ordered At This Time.  If you have labs (blood work) drawn today and your tests are completely normal, you will receive your results only by: MyChart Message (if you have MyChart) OR A paper copy in the mail If you have any lab test that is abnormal or we need to change your treatment, we will call you to review the results.  Testing/Procedures: None Ordered At This Time.   Follow-Up: At [facility], you and your health needs are our priority.  As part of our continuing mission to provide you with exceptional heart care, we have created designated Provider Care Teams.  These Care Teams include your primary Cardiologist (physician) and Advanced Practice Providers (APPs -  Physician Assistants and Nurse Practitioners) who all work together to provide you with the care you need, when you need it.  Your next appointment:   1 year(s)  Provider:   Gayatri A Acharya, MD     

## 2022-06-11 NOTE — Progress Notes (Signed)
Remote pacemaker transmission.   

## 2022-07-14 DIAGNOSIS — Z961 Presence of intraocular lens: Secondary | ICD-10-CM | POA: Diagnosis not present

## 2022-07-14 DIAGNOSIS — H401131 Primary open-angle glaucoma, bilateral, mild stage: Secondary | ICD-10-CM | POA: Diagnosis not present

## 2022-07-14 DIAGNOSIS — H31002 Unspecified chorioretinal scars, left eye: Secondary | ICD-10-CM | POA: Diagnosis not present

## 2022-08-12 ENCOUNTER — Other Ambulatory Visit: Payer: Self-pay | Admitting: Cardiology

## 2022-08-12 ENCOUNTER — Ambulatory Visit (INDEPENDENT_AMBULATORY_CARE_PROVIDER_SITE_OTHER): Payer: Medicare PPO

## 2022-08-12 DIAGNOSIS — I495 Sick sinus syndrome: Secondary | ICD-10-CM | POA: Diagnosis not present

## 2022-08-13 ENCOUNTER — Other Ambulatory Visit: Payer: Self-pay | Admitting: Cardiology

## 2022-08-13 NOTE — Telephone Encounter (Signed)
Prescription refill request for Eliquis received. Indication: PAF Last office visit: 05/21/22  Elspeth Cho MD Scr: 1.3 on 05/15/22  Epic Age: 82 Weight: 73.2kg   Based on above findings Eliquis 5mg  twice daily is the appropriate dose.  Refill approved.

## 2022-08-14 LAB — CUP PACEART REMOTE DEVICE CHECK
Battery Remaining Longevity: 174 mo
Battery Remaining Percentage: 100 %
Brady Statistic RA Percent Paced: 3 %
Date Time Interrogation Session: 20240723043100
Implantable Lead Implant Date: 20230724
Implantable Lead Implant Date: 20230724
Implantable Lead Location: 753859
Implantable Lead Location: 753860
Implantable Lead Model: 7840
Implantable Lead Model: 7841
Implantable Lead Serial Number: 1061671
Implantable Lead Serial Number: 1297615
Implantable Pulse Generator Implant Date: 20230724
Lead Channel Impedance Value: 599 Ohm
Lead Channel Pacing Threshold Amplitude: 0.8 V
Lead Channel Pacing Threshold Amplitude: 1 V
Lead Channel Pacing Threshold Pulse Width: 0.4 ms
Lead Channel Pacing Threshold Pulse Width: 0.4 ms
Lead Channel Setting Pacing Amplitude: 2 V
Lead Channel Setting Pacing Amplitude: 2.5 V
Lead Channel Setting Pacing Pulse Width: 0.4 ms
Lead Channel Setting Sensing Sensitivity: 2.5 mV
Zone Setting Status: 755011

## 2022-08-29 NOTE — Progress Notes (Signed)
Remote pacemaker transmission.   

## 2022-11-03 DIAGNOSIS — D225 Melanocytic nevi of trunk: Secondary | ICD-10-CM | POA: Diagnosis not present

## 2022-11-03 DIAGNOSIS — L570 Actinic keratosis: Secondary | ICD-10-CM | POA: Diagnosis not present

## 2022-11-03 DIAGNOSIS — L814 Other melanin hyperpigmentation: Secondary | ICD-10-CM | POA: Diagnosis not present

## 2022-11-03 DIAGNOSIS — Z85828 Personal history of other malignant neoplasm of skin: Secondary | ICD-10-CM | POA: Diagnosis not present

## 2022-11-03 DIAGNOSIS — L821 Other seborrheic keratosis: Secondary | ICD-10-CM | POA: Diagnosis not present

## 2022-11-03 DIAGNOSIS — L578 Other skin changes due to chronic exposure to nonionizing radiation: Secondary | ICD-10-CM | POA: Diagnosis not present

## 2022-11-11 ENCOUNTER — Ambulatory Visit (INDEPENDENT_AMBULATORY_CARE_PROVIDER_SITE_OTHER): Payer: Medicare PPO

## 2022-11-11 DIAGNOSIS — I495 Sick sinus syndrome: Secondary | ICD-10-CM | POA: Diagnosis not present

## 2022-11-13 LAB — CUP PACEART REMOTE DEVICE CHECK
Battery Remaining Longevity: 174 mo
Battery Remaining Percentage: 100 %
Brady Statistic RA Percent Paced: 3 %
Brady Statistic RV Percent Paced: 0 %
Date Time Interrogation Session: 20241022043100
Implantable Lead Connection Status: 753985
Implantable Lead Connection Status: 753985
Implantable Lead Implant Date: 20230724
Implantable Lead Implant Date: 20230724
Implantable Lead Location: 753859
Implantable Lead Location: 753860
Implantable Lead Model: 7840
Implantable Lead Model: 7841
Implantable Lead Serial Number: 1061671
Implantable Lead Serial Number: 1297615
Implantable Pulse Generator Implant Date: 20230724
Lead Channel Impedance Value: 598 Ohm
Lead Channel Impedance Value: 658 Ohm
Lead Channel Pacing Threshold Amplitude: 0.7 V
Lead Channel Pacing Threshold Amplitude: 1 V
Lead Channel Pacing Threshold Pulse Width: 0.4 ms
Lead Channel Pacing Threshold Pulse Width: 0.4 ms
Lead Channel Setting Pacing Amplitude: 2 V
Lead Channel Setting Pacing Amplitude: 2.5 V
Lead Channel Setting Pacing Pulse Width: 0.4 ms
Lead Channel Setting Sensing Sensitivity: 2.5 mV
Pulse Gen Serial Number: 597897
Zone Setting Status: 755011

## 2022-11-20 NOTE — Progress Notes (Signed)
  Electrophysiology Office Note:   ID:  William Daniels, Britten 26-Jul-1940, MRN [id number]  Primary Cardiologist: Parke Poisson, MD Electrophysiologist: Lanier Prude, MD      History of Present Illness:   William Daniels is a 82 y.o. male with h/o symptomatic bradycardia/SSS s/p PPM and paroxysmal AF seen today for routine electrophysiology followup.   Since last being seen in our clinic the patient reports doing well overall.  he denies chest pain, palpitations, dyspnea, PND, orthopnea, nausea, vomiting, dizziness, syncope, edema, weight gain, or early satiety.   Review of systems complete and found to be negative unless listed in HPI.   EP Information / Studies Reviewed:    EKG is ordered today. Personal review as below.  EKG Interpretation Date/Time:  Friday November 21 2022 11:00:42 EDT Ventricular Rate:  70 PR Interval:  198 QRS Duration:  94 QT Interval:  378 QTC Calculation: 408 R Axis:   18  Text Interpretation: Normal sinus rhythm with sinus arrhythmia Incomplete right bundle branch block Anterior infarct (cited on or before 13-Aug-2021) Confirmed by Maxine Glenn [id number]) on 11/21/2022 11:04:32 AM    PPM Interrogation-  reviewed in detail today,  See PACEART report.  Device History: Field seismologist PPM implanted 07/2021 for Sinus Node Dysfunction  Physical Exam:   VS:  BP 102/66   Pulse 70   Ht 5\' 8"  (1.727 m)   Wt 162 lb (73.5 kg)   SpO2 98%   BMI 24.63 kg/m    Wt Readings from Last 3 Encounters:  11/21/22 162 lb (73.5 kg)  05/21/22 161 lb 6.4 oz (73.2 kg)  11/19/21 151 lb 6.4 oz (68.7 kg)     GEN: Well nourished, well developed in no acute distress NECK: No JVD; No carotid bruits CARDIAC: Regular rate and rhythm, no murmurs, rubs, gallops RESPIRATORY:  Clear to auscultation without rales, wheezing or rhonchi  ABDOMEN: Soft, non-tender, non-distended EXTREMITIES:  No edema; No deformity   ASSESSMENT AND PLAN:    Sick sinus syndrome  s/p Environmental manager PPM  Normal PPM function See Arita Miss Art report No changes today  AF/AFL Low burden, <1% on device interrogation Continue eliquis 5 mg BID for CHA2DS2/VASc of at least 4.  Labs from PCP next week      Disposition:   Follow up with Dr. Lalla Brothers in 12 months  Signed, Graciella Freer, PA-C

## 2022-11-21 ENCOUNTER — Ambulatory Visit: Payer: Medicare PPO | Attending: Student | Admitting: Student

## 2022-11-21 ENCOUNTER — Encounter: Payer: Self-pay | Admitting: Student

## 2022-11-21 VITALS — BP 102/66 | HR 70 | Ht 68.0 in | Wt 162.0 lb

## 2022-11-21 DIAGNOSIS — I480 Paroxysmal atrial fibrillation: Secondary | ICD-10-CM

## 2022-11-21 DIAGNOSIS — I495 Sick sinus syndrome: Secondary | ICD-10-CM

## 2022-11-21 LAB — CUP PACEART INCLINIC DEVICE CHECK
Date Time Interrogation Session: 20241101113941
Implantable Lead Connection Status: 753985
Implantable Lead Connection Status: 753985
Implantable Lead Implant Date: 20230724
Implantable Lead Implant Date: 20230724
Implantable Lead Location: 753859
Implantable Lead Location: 753860
Implantable Lead Model: 7840
Implantable Lead Model: 7841
Implantable Lead Serial Number: 1061671
Implantable Lead Serial Number: 1297615
Implantable Pulse Generator Implant Date: 20230724
Lead Channel Impedance Value: 617 Ohm
Lead Channel Impedance Value: 675 Ohm
Lead Channel Pacing Threshold Amplitude: 1 V
Lead Channel Pacing Threshold Amplitude: 1 V
Lead Channel Pacing Threshold Pulse Width: 0.4 ms
Lead Channel Pacing Threshold Pulse Width: 0.4 ms
Lead Channel Sensing Intrinsic Amplitude: 17.7 mV
Lead Channel Sensing Intrinsic Amplitude: 5.2 mV
Lead Channel Setting Pacing Amplitude: 2 V
Lead Channel Setting Pacing Amplitude: 2.5 V
Lead Channel Setting Pacing Pulse Width: 0.4 ms
Lead Channel Setting Sensing Sensitivity: 2.5 mV
Pulse Gen Serial Number: 597897
Zone Setting Status: 755011

## 2022-11-21 NOTE — Patient Instructions (Signed)

## 2022-11-24 ENCOUNTER — Other Ambulatory Visit: Payer: Self-pay

## 2022-11-24 MED ORDER — DILTIAZEM HCL 30 MG PO TABS: 30.0000 mg | ORAL_TABLET | Freq: Four times a day (QID) | ORAL | 11 refills | Status: DC | PRN

## 2022-11-26 DIAGNOSIS — M8588 Other specified disorders of bone density and structure, other site: Secondary | ICD-10-CM | POA: Diagnosis not present

## 2022-11-26 DIAGNOSIS — M1990 Unspecified osteoarthritis, unspecified site: Secondary | ICD-10-CM | POA: Diagnosis not present

## 2022-11-26 DIAGNOSIS — E782 Mixed hyperlipidemia: Secondary | ICD-10-CM | POA: Diagnosis not present

## 2022-11-26 DIAGNOSIS — E1122 Type 2 diabetes mellitus with diabetic chronic kidney disease: Secondary | ICD-10-CM | POA: Diagnosis not present

## 2022-11-26 DIAGNOSIS — I4891 Unspecified atrial fibrillation: Secondary | ICD-10-CM | POA: Diagnosis not present

## 2022-11-26 DIAGNOSIS — K219 Gastro-esophageal reflux disease without esophagitis: Secondary | ICD-10-CM | POA: Diagnosis not present

## 2022-11-26 DIAGNOSIS — I495 Sick sinus syndrome: Secondary | ICD-10-CM | POA: Diagnosis not present

## 2022-11-26 DIAGNOSIS — I700 Atherosclerosis of aorta: Secondary | ICD-10-CM | POA: Diagnosis not present

## 2022-11-26 DIAGNOSIS — Z0000 Encounter for general adult medical examination without abnormal findings: Secondary | ICD-10-CM | POA: Diagnosis not present

## 2022-11-26 DIAGNOSIS — N183 Chronic kidney disease, stage 3 unspecified: Secondary | ICD-10-CM | POA: Diagnosis not present

## 2022-11-28 NOTE — Progress Notes (Signed)
Remote pacemaker transmission.   

## 2022-12-29 DIAGNOSIS — H401131 Primary open-angle glaucoma, bilateral, mild stage: Secondary | ICD-10-CM | POA: Diagnosis not present

## 2022-12-29 DIAGNOSIS — H524 Presbyopia: Secondary | ICD-10-CM | POA: Diagnosis not present

## 2022-12-29 DIAGNOSIS — H52203 Unspecified astigmatism, bilateral: Secondary | ICD-10-CM | POA: Diagnosis not present

## 2022-12-29 DIAGNOSIS — Z961 Presence of intraocular lens: Secondary | ICD-10-CM | POA: Diagnosis not present

## 2023-01-15 ENCOUNTER — Other Ambulatory Visit: Payer: Self-pay | Admitting: Internal Medicine

## 2023-01-19 ENCOUNTER — Encounter: Payer: Self-pay | Admitting: Internal Medicine

## 2023-01-20 MED ORDER — APIXABAN 5 MG PO TABS: 5.0000 mg | ORAL_TABLET | Freq: Two times a day (BID) | ORAL | 1 refills | Status: DC

## 2023-02-10 ENCOUNTER — Ambulatory Visit (INDEPENDENT_AMBULATORY_CARE_PROVIDER_SITE_OTHER): Payer: Medicare PPO

## 2023-02-10 DIAGNOSIS — I4891 Unspecified atrial fibrillation: Secondary | ICD-10-CM

## 2023-02-10 DIAGNOSIS — I495 Sick sinus syndrome: Secondary | ICD-10-CM | POA: Diagnosis not present

## 2023-02-10 LAB — CUP PACEART REMOTE DEVICE CHECK
Battery Remaining Longevity: 174 mo
Battery Remaining Percentage: 100 %
Brady Statistic RA Percent Paced: 6 %
Brady Statistic RV Percent Paced: 0 %
Date Time Interrogation Session: 20250121043200
Implantable Lead Connection Status: 753985
Implantable Lead Connection Status: 753985
Implantable Lead Implant Date: 20230724
Implantable Lead Implant Date: 20230724
Implantable Lead Location: 753859
Implantable Lead Location: 753860
Implantable Lead Model: 7840
Implantable Lead Model: 7841
Implantable Lead Serial Number: 1061671
Implantable Lead Serial Number: 1297615
Implantable Pulse Generator Implant Date: 20230724
Lead Channel Impedance Value: 617 Ohm
Lead Channel Impedance Value: 649 Ohm
Lead Channel Pacing Threshold Amplitude: 0.8 V
Lead Channel Pacing Threshold Amplitude: 1.1 V
Lead Channel Pacing Threshold Pulse Width: 0.4 ms
Lead Channel Pacing Threshold Pulse Width: 0.4 ms
Lead Channel Setting Pacing Amplitude: 2 V
Lead Channel Setting Pacing Amplitude: 2.5 V
Lead Channel Setting Pacing Pulse Width: 0.4 ms
Lead Channel Setting Sensing Sensitivity: 2.5 mV
Pulse Gen Serial Number: 597897
Zone Setting Status: 755011

## 2023-02-11 ENCOUNTER — Encounter: Payer: Self-pay | Admitting: Cardiology

## 2023-03-24 NOTE — Progress Notes (Signed)
 Remote pacemaker transmission.

## 2023-04-22 ENCOUNTER — Encounter: Payer: Self-pay | Admitting: Internal Medicine

## 2023-04-23 NOTE — Telephone Encounter (Signed)
 Patient sends my chart message reporting symptoms suspecting he is back in and out of AF.   Transmission Reviewed:  Patient is having frequent, brief/intermittent periods of atrial events between 3/24 and 04/22/23. AT/Flutter and-AF  with some RVR events device flagged as short NSVT events.  (Limited on EGM data to review).   Patient states he can feel he is going in and out of rhythm.  SX's include malaise, and tightness to chest.  Wants to know what he should do to manage, if any changes are needed for symptoms.  His bp also has been elevated at times but has hx of hypotension so afraid to take the prn Diltiazem.   Otherwise has been taking all meds as prescribed, no major life/health changes that he notes.    (Appointment has been made for Monday, 04/27/23 at 930am with AF clinic to discuss with patient).         1 hour 58 min event: NO EGM rates appear Aflutter.

## 2023-04-27 ENCOUNTER — Ambulatory Visit (HOSPITAL_COMMUNITY)
Admission: RE | Admit: 2023-04-27 | Discharge: 2023-04-27 | Disposition: A | Discharge status: Home / Self Care | Source: Ambulatory Visit | Attending: Physician Assistant | Admitting: Physician Assistant

## 2023-04-27 ENCOUNTER — Encounter (HOSPITAL_COMMUNITY): Payer: Self-pay | Admitting: Physician Assistant

## 2023-04-27 VITALS — BP 110/84 | HR 79 | Ht 68.0 in | Wt 163.2 lb

## 2023-04-27 DIAGNOSIS — Z7901 Long term (current) use of anticoagulants: Secondary | ICD-10-CM | POA: Diagnosis not present

## 2023-04-27 DIAGNOSIS — I495 Sick sinus syndrome: Secondary | ICD-10-CM | POA: Diagnosis not present

## 2023-04-27 DIAGNOSIS — I480 Paroxysmal atrial fibrillation: Secondary | ICD-10-CM | POA: Diagnosis not present

## 2023-04-27 DIAGNOSIS — E785 Hyperlipidemia, unspecified: Secondary | ICD-10-CM | POA: Diagnosis not present

## 2023-04-27 DIAGNOSIS — I2510 Atherosclerotic heart disease of native coronary artery without angina pectoris: Secondary | ICD-10-CM | POA: Insufficient documentation

## 2023-04-27 DIAGNOSIS — D6869 Other thrombophilia: Secondary | ICD-10-CM | POA: Diagnosis not present

## 2023-04-27 DIAGNOSIS — I4891 Unspecified atrial fibrillation: Secondary | ICD-10-CM | POA: Diagnosis present

## 2023-04-27 DIAGNOSIS — Z950 Presence of cardiac pacemaker: Secondary | ICD-10-CM | POA: Diagnosis not present

## 2023-04-27 DIAGNOSIS — I10 Essential (primary) hypertension: Secondary | ICD-10-CM | POA: Insufficient documentation

## 2023-04-27 NOTE — Progress Notes (Signed)
 Primary Care Physician: Irven Coe, MD Primary Cardiologist: Parke Poisson, MD Electrophysiologist: Lanier Prude, MD  Referring Physician: Dr Stevie Kern is a 83 y.o. male with a history of SSS s/p PPM, HTN, CAD, HLD, atrial fibrillation who presents for follow up in the [facility].  The patient was initially diagnosed with atrial fibrillation 07/2021 after presenting to the ED with chest pain. He was found to be in afib with RVR and started on BB. He had a syncopal event with a 13 second post termination pause, now s/p PPM. Patient is on Eliquis for stroke prevention. Patient sent a message to the device clinic reporting symptoms of afib. Device interrogation showed brief but frequent episodes of afib, some with RVR.   Patient presents today for follow up for atrial fibrillation. Patient sent a Mychart message 4/2 reporting symptoms of afib for about one week. A manual device transmission showed more frequent afib episodes. There were no specific triggers that he could identify. For the last several days, he has not had any symptoms and is feeling well.   Today, he denies symptoms of palpitations, chest pain, shortness of breath, orthopnea, PND, lower extremity edema, dizziness, presyncope, syncope, snoring, daytime somnolence, bleeding, or neurologic sequela. The patient is tolerating medications without difficulties and is otherwise without complaint today.    Atrial Fibrillation Risk Factors:  he does not have symptoms or diagnosis of sleep apnea. he does not have a history of rheumatic fever. he does not have a history of alcohol use. The patient does not have a history of early familial atrial fibrillation or other arrhythmias.  Atrial Fibrillation Management history:  Previous antiarrhythmic drugs: none Previous cardioversions: none Previous ablations: none Anticoagulation history: Eliquis  ROS- All systems are reviewed and  negative except as per the HPI above.  Past Medical History:  Diagnosis Date   Glaucoma    Hyperlipidemia    Hypertension     Current Outpatient Medications  Medication Sig Dispense Refill   ACCU-CHEK AVIVA PLUS test strip 1 each by Other route as needed for other.     acetaminophen (TYLENOL) 500 MG tablet Take 1,000 mg by mouth every 6 (six) hours as needed for mild pain.     apixaban (ELIQUIS) 5 MG TABS tablet Take 1 tablet (5 mg total) by mouth 2 (two) times daily. 180 tablet 1   Ascorbic Acid (VITAMIN C PO) Take 1 tablet by mouth daily.     cetirizine (ZYRTEC) 10 MG tablet Take 10 mg by mouth daily.     Cholecalciferol (VITAMIN D-3 PO) Take 1 capsule by mouth daily.     diltiazem (CARDIZEM) 30 MG tablet Take 1 tablet (30 mg total) by mouth every 6 (six) hours as needed (FOR AFIB EPISODES). 30 tablet 11   famotidine (PEPCID) 20 MG tablet Take 20 mg by mouth daily.     fenofibrate 160 MG tablet Take 160 mg by mouth daily.     latanoprost (XALATAN) 0.005 % ophthalmic solution Place 1 drop into both eyes every evening.     metoprolol succinate (TOPROL-XL) 25 MG 24 hr tablet TAKE ONE TABLET BY MOUTH DAILY 90 tablet 3   Multiple Vitamins-Minerals (CENTRUM SILVER 50+MEN) TABS Take 1 tablet by mouth daily.     Polyethyl Glycol-Propyl Glycol (SYSTANE HYDRATION PF OP) Place 1 drop into both eyes daily as needed (dry eye).     simvastatin (ZOCOR) 40 MG tablet Take 40 mg by mouth at  bedtime.     No current facility-administered medications for this encounter.    Physical Exam: BP 110/84   Pulse 79   Ht 5\' 8"  (1.727 m)   Wt 74 kg   BMI 24.81 kg/m   GEN: Well nourished, well developed in no acute distress CARDIAC: Regular rate and rhythm, no murmurs, rubs, gallops RESPIRATORY:  Clear to auscultation without rales, wheezing or rhonchi  ABDOMEN: Soft, non-tender, non-distended EXTREMITIES:  No edema; No deformity   Wt Readings from Last 3 Encounters:  04/27/23 74 kg  11/21/22 73.5 kg   05/21/22 73.2 kg     EKG today demonstrates  SR Vent. rate 79 BPM PR interval 184 ms QRS duration 96 ms QT/QTcB 374/428 ms   Echo 10/11/21 demonstrated   1. Left ventricular ejection fraction, by estimation, is 60 to 65%. The  left ventricle has normal function. The left ventricle has no regional  wall motion abnormalities. There is mild left ventricular hypertrophy.   2. Right ventricular systolic function is normal. The right ventricular  size is normal. There is normal pulmonary artery systolic pressure.   3. The mitral valve is abnormal. Mild to moderate mitral valve  regurgitation. No evidence of mitral stenosis.   4. Tricuspid valve regurgitation is moderate.   5. Aortic dilatation noted. There is borderline dilatation of the  ascending aorta, measuring 39 mm.   6. The inferior vena cava is normal in size with greater than 50%  respiratory variability, suggesting right atrial pressure of 3 mmHg.   Comparison(s): No significant change from prior study.    CHA2DS2-VASc Score = 4  The patient's score is based upon: CHF History: 0 HTN History: 1 Diabetes History: 0 Stroke History: 0 Vascular Disease History: 1 Age Score: 2 Gender Score: 0       ASSESSMENT AND PLAN: Paroxysmal Atrial Fibrillation (ICD10:  I48.0) The patient's CHA2DS2-VASc score is 4, indicating a 4.8% annual risk of stroke.   Frequent afib episodes on PPM, symptoms of chest tightness and fatigue.  He is in SR today.  Would avoid class IC with coronary calcifications. Can consider Multaq, dofetilide, amiodarone, or ablation. Patient would like to continue watchful waiting for now since his symptoms have resolved.  Continue Eliquis 5 mg BID Continue Toprol 25 mg daily  Secondary Hypercoagulable State (ICD10:  D68.69) The patient is at significant risk for stroke/thromboembolism based upon his CHA2DS2-VASc Score of 4.  Continue Apixaban (Eliquis). No bleeding issues.  HTN Stable on current  regimen  SSS/tachybradycardia syndrome S/p PPM, followed by Dr Lalla Brothers  CAD CAC score 166 on CT No anginal symptoms Followed by Dr Jacques Navy     Follow up in the AF clinic in 6 months.        Jorja Loa PA-C Af[facility] [address] [place], [place] [address] [phone]

## 2023-05-12 ENCOUNTER — Ambulatory Visit: Payer: Medicare PPO

## 2023-05-12 DIAGNOSIS — I495 Sick sinus syndrome: Secondary | ICD-10-CM | POA: Diagnosis not present

## 2023-05-13 LAB — CUP PACEART REMOTE DEVICE CHECK
Battery Remaining Longevity: 174 mo
Battery Remaining Percentage: 100 %
Brady Statistic RA Percent Paced: 7 %
Brady Statistic RV Percent Paced: 0 %
Date Time Interrogation Session: 20250422043100
Implantable Lead Connection Status: 753985
Implantable Lead Connection Status: 753985
Implantable Lead Implant Date: 20230724
Implantable Lead Implant Date: 20230724
Implantable Lead Location: 753859
Implantable Lead Location: 753860
Implantable Lead Model: 7840
Implantable Lead Model: 7841
Implantable Lead Serial Number: 1061671
Implantable Lead Serial Number: 1297615
Implantable Pulse Generator Implant Date: 20230724
Lead Channel Impedance Value: 621 Ohm
Lead Channel Impedance Value: 625 Ohm
Lead Channel Pacing Threshold Amplitude: 0.8 V
Lead Channel Pacing Threshold Amplitude: 1.2 V
Lead Channel Pacing Threshold Pulse Width: 0.4 ms
Lead Channel Pacing Threshold Pulse Width: 0.4 ms
Lead Channel Setting Pacing Amplitude: 2 V
Lead Channel Setting Pacing Amplitude: 2.5 V
Lead Channel Setting Pacing Pulse Width: 0.4 ms
Lead Channel Setting Sensing Sensitivity: 2.5 mV
Pulse Gen Serial Number: 597897
Zone Setting Status: 755011

## 2023-05-16 ENCOUNTER — Encounter: Payer: Self-pay | Admitting: Cardiology

## 2023-05-20 ENCOUNTER — Ambulatory Visit: Payer: Medicare PPO | Admitting: Internal Medicine

## 2023-05-20 NOTE — Progress Notes (Unsigned)
 Office Visit    Patient Name: William Daniels Date of Encounter: 05/21/2023  Primary Care Provider:  Benedetto Brady, MD Primary Cardiologist:  Euell Herrlich, MD  Chief Complaint    83 year old male with a history of nonobstructive CAD, paroxysmal atrial fibrillation, sick sinus syndrome s/p PPM, hypertension, and hyperlipidemia who presents for follow-up related to CAD and atrial fibrillation.  Past Medical History    Past Medical History:  Diagnosis Date   Glaucoma    Hyperlipidemia    Hypertension    Past Surgical History:  Procedure Laterality Date   PACEMAKER IMPLANT N/A 08/12/2021   Procedure: PACEMAKER IMPLANT;  Surgeon: Boyce Byes, MD;  Location: [facility];  Service: Cardiovascular;  Laterality: N/A;    Allergies  No Known Allergies   Labs/Other Studies Reviewed    The following studies were reviewed today:  Cardiac Studies & Procedures   ______________________________________________________________________________________________     ECHOCARDIOGRAM  ECHOCARDIOGRAM LIMITED 10/11/2021  Narrative ECHOCARDIOGRAM LIMITED REPORT    Patient Name:   William Daniels Date of Exam: 10/11/2021 Medical Rec #:  [id number]     Height:       67.5 in Accession #:    [id number]    Weight:       154.2 lb Date of Birth:  April 27, 1940     BSA:          1.821 m Patient Age:    81 years      BP:           126/68 mmHg Patient Gender: M             HR:           68 bpm. Exam Location:  [address]  Procedure: Limited Echo, Limited Color Doppler and Cardiac Doppler  Indications:    R07.9 Chest pain  History:        Patient has prior history of Echocardiogram examinations, most recent 08/09/2021. Pacemaker, Arrythmias:Atrial Fibrillation, Signs/Symptoms:Chest Pain; Risk Factors:Hypertension, Diabetes and Dyslipidemia.  Sonographer:    Lula Sale RDCS Referring Phys: [id number] Ethelle Herb A ACHARYA  IMPRESSIONS   1. Left ventricular ejection fraction, by  estimation, is 60 to 65%. The left ventricle has normal function. The left ventricle has no regional wall motion abnormalities. There is mild left ventricular hypertrophy. 2. Right ventricular systolic function is normal. The right ventricular size is normal. There is normal pulmonary artery systolic pressure. 3. The mitral valve is abnormal. Mild to moderate mitral valve regurgitation. No evidence of mitral stenosis. 4. Tricuspid valve regurgitation is moderate. 5. Aortic dilatation noted. There is borderline dilatation of the ascending aorta, measuring 39 mm. 6. The inferior vena cava is normal in size with greater than 50% respiratory variability, suggesting right atrial pressure of 3 mmHg.  Comparison(s): No significant change from prior study.  FINDINGS Left Ventricle: Left ventricular ejection fraction, by estimation, is 60 to 65%. The left ventricle has normal function. The left ventricle has no regional wall motion abnormalities. The left ventricular internal cavity size was normal in size. There is mild left ventricular hypertrophy.  Right Ventricle: The right ventricular size is normal. No increase in right ventricular wall thickness. Right ventricular systolic function is normal. There is normal pulmonary artery systolic pressure. The tricuspid regurgitant velocity is 2.65 m/s, and with an assumed right atrial pressure of 3 mmHg, the estimated right ventricular systolic pressure is 31.1 mmHg.  Mitral Valve: The mitral valve is abnormal. Mild to moderate mitral valve regurgitation. No  evidence of mitral valve stenosis.  Tricuspid Valve: The tricuspid valve is normal in structure. Tricuspid valve regurgitation is moderate . No evidence of tricuspid stenosis.  Aorta: The aortic root is normal in size and structure and aortic dilatation noted. There is borderline dilatation of the ascending aorta, measuring 39 mm.  Venous: The inferior vena cava is normal in size with greater than 50%  respiratory variability, suggesting right atrial pressure of 3 mmHg.  Additional Comments: A device lead is visualized. LEFT VENTRICLE PLAX 2D LVIDd:         3.50 cm   Diastology LVIDs:         2.20 cm   LV e' medial:    6.45 cm/s LV PW:         1.10 cm   LV E/e' medial:  14.4 LV IVS:        1.10 cm   LV e' lateral:   7.18 cm/s LVOT diam:     2.00 cm   LV E/e' lateral: 12.9 LV SV:         54 LV SV Index:   29 LVOT Area:     3.14 cm   RIGHT VENTRICLE            IVC RVSP:           31.1 mmHg  IVC diam: 1.30 cm  LEFT ATRIUM         Index       RIGHT ATRIUM LA diam:    3.90 cm 2.14 cm/m  RA Pressure: 3.00 mmHg AORTIC VALVE LVOT Vmax:   89.13 cm/s LVOT Vmean:  53.300 cm/s LVOT VTI:    0.170 m  AORTA Ao Root diam: 3.60 cm Ao Asc diam:  4.10 cm  MITRAL VALVE               TRICUSPID VALVE MV Area (PHT): 3.76 cm    TR Peak grad:   28.1 mmHg MV Decel Time: 202 msec    TR Vmax:        265.00 cm/s MV E velocity: 92.72 cm/s  Estimated RAP:  3.00 mmHg MV A velocity: 86.06 cm/s  RVSP:           31.1 mmHg MV E/A ratio:  1.08 SHUNTS Systemic VTI:  0.17 m Systemic Diam: 2.00 cm  Sheryle Donning MD Electronically signed by Sheryle Donning MD Signature Date/Time: 10/15/2021/9:39:04 PM    Final      CT SCANS  CT CORONARY MORPH W/CTA COR W/SCORE 10/23/2021  Addendum 10/23/2021  3:17 PM ADDENDUM REPORT: 10/23/2021 15:15  EXAM: OVER-READ INTERPRETATION  CT CHEST  The following report is an over-read performed by radiologist Dr. Collie Days [facility] on 10/23/2021. This over-read does not include interpretation of cardiac or coronary anatomy or pathology. The coronary CTA interpretation by the cardiologist is attached.  COMPARISON:  August 09, 2021.  FINDINGS: No definite abnormality seen involving visualized portions of the extracardiac vascular structures. Small sliding-type hiatal hernia is noted. Visualized portion of upper abdomen is  unremarkable. Small right pleural effusion is noted with adjacent subsegmental atelectasis. Visualized skeleton is unremarkable.  IMPRESSION: Small sliding-type hiatal hernia. Small right pleural effusion with adjacent subsegmental atelectasis.   Electronically Signed By: Rosalene Colon M.D. On: 10/23/2021 15:15  Narrative CLINICAL DATA:  This is a 83 year old male with anginal symptoms.  EXAM: Cardiac/Coronary  CTA  TECHNIQUE: The patient was scanned on a [employer].  FINDINGS: A  100 kV prospective scan was triggered in the descending thoracic aorta at 111 HU's. Axial non-contrast 3 mm slices were carried out through the heart. The data set was analyzed on a dedicated work station and scored using the Agatson method. Gantry rotation speed was 250 msecs and collimation was .6 mm. No beta blockade and 0.8 mg of sl NTG was given. The 3D data set was reconstructed in 5% intervals of the 67-82 % of the R-R cycle. Diastolic phases were analyzed on a dedicated work station using MPR, MIP and VRT modes. The patient received 80 cc of contrast.  Image Quality: Fair, Misregistration artifact.  Aorta: Normal size. Mild aortic root calcifications.  No dissection.  Aortic Valve:  Trileaflet.  No calcifications.  Coronary Arteries:  Normal coronary origin.  Right dominance.  RCA is a large dominant artery that gives rise to PDA and PLA. There is minimal (<24%) soft plaque in the proximal RCA. The mid and distal RCA in the with no plaques.  Left main is a large artery that gives rise to LAD and LCX arteries.  LAD is a large vessel that has no plaque. The proximal LAD with mild (24-49%) calcified plaques. The mid LAD with no plaque. In the distal LAD there is a stair step artifact which also there appears to be a focal calcified plaques which can not well quantified. D1 is a medium sized vessel with mild diffuse calcifications. The mid D1 with mild plaques. Distal D1  with no plaques.  LCX is a non-dominant artery that gives rise to 2 OM1 branches. OM1 has a high take off and is a medium sized vessel. The OM2 vessel is large. There is no plaque in the LCX. The mid OM2 vessel with 3 separate focal mild calcified plaques. The proximal to mid minimal (<24%) calcified plaques. The distal focal OM2 vessel with a focal minimum calcified plaque. The OM1 vessel with no plaque.  Coronary Calcium Score:  Left main: 0  Left anterior descending artery: 84.2  Left circumflex artery: 81.6  Right coronary artery: 0  Total: 166  Percentile: 33  Other findings:  Normal pulmonary vein drainage into the left atrium.  Normal left atrial appendage without a thrombus.  Normal size of the pulmonary artery.  Pacemaker leads appreciated.  IMPRESSION: 1. Coronary calcium score of 166. This was 84 percentile for age and sex matched control.  2. Normal coronary origin with right dominance.  3. CAD-RADS 2. Mild non-obstructive CAD (25-49%). Consider non-atherosclerotic causes of chest pain. Consider preventive therapy and risk factor modification.  The noncardiac portion of this study will be interpreted in separate report by the radiologist.  Electronically Signed: By: Kardie  Tobb D.O. On: 10/23/2021 13:14     ______________________________________________________________________________________________     Recent Labs: No results found for requested labs within last 365 days.  Recent Lipid Panel    Component Value Date/Time   CHOL 113 08/10/2021 0205   TRIG 90 08/10/2021 0205   HDL 56 08/10/2021 0205   CHOLHDL 2.0 08/10/2021 0205   VLDL 18 08/10/2021 0205   LDLCALC 39 08/10/2021 0205    History of Present Illness   83 year old male with the above past medical history including nonobstructive CAD, paroxysmal atrial fibrillation, sick sinus syndrome s/p PPM, hypertension, and hyperlipidemia.  He was hospitalized in July 2023 in the  setting of atrial fibrillation with RVR.  He ultimately required pacemaker implant in the setting of tachycardia- bradycardia syndrome (he had a syncopal event with a 13-second  posttermination pause), following with EP.  He has remained on Eliquis .  Echocardiogram in 09/2021 showed EF 60 to 65%, no RWMA, mild LVH, mild to moderate MR, moderate TR, borderline dilation of ascending aorta measuring 39 mm. Coronary CTA in 10/2021 showed coronary calcium score 166 (33rd percentile). He was seen in the A-fib clinic on 04/27/2023 and was doing well.  He was noted to have more frequent episodes of atrial fibrillation on his device interrogation.  It was noted that should he have progressive symptoms, Multaq, dofetilide, amiodarone or ablation could be considered.  Patient opted for watchful waiting for now.  He presents today for follow-up.  Since his last visit accompanied by his wife.  Normal from a cardiac standpoint.  He denies any further episodes of breakthrough atrial fibrillation.  He denies any chest pain, palpitations, dizziness, dyspnea, edema, PND, orthopnea, weight gain.  Overall he reports feeling well.  BP well-controlled.  Follow-up in 1 year with Dr. Chancy Comber.  He will have fasting lipids with his PCP next week.  Nonobstructive CAD:  Coronary CTA in 10/2021 showed coronary calcium score 166 (33rd percentile).  Paroxysmal atrial fibrillation:  He was seen in the A-fib clinic on 04/27/2023 and was doing well.  He was noted to have more frequent episodes of atrial fibrillation on his device interrogation.  It was noted that should he have progressive symptoms, Multaq, dofetilide, amiodarone or ablation could be considered.  Patient opted for watchful waiting for now. Tachycardia-bradycardia syndrome/SSS: S/p PPM. Following with EP.  Hypertension: BP well controlled. Continue current antihypertensive regimen.  Hyperlipidemia: LDL was 59 in 04/2022.  Disposition: Follow-up as scheduled with the a fib clinic.  Follow-up with general cardiology in 1 year.   Home Medications    Current Outpatient Medications  Medication Sig Dispense Refill   ACCU-CHEK AVIVA PLUS test strip 1 each by Other route as needed for other.     acetaminophen  (TYLENOL ) 500 MG tablet Take 1,000 mg by mouth every 6 (six) hours as needed for mild pain.     apixaban  (ELIQUIS ) 5 MG TABS tablet Take 1 tablet (5 mg total) by mouth 2 (two) times daily. 180 tablet 1   Ascorbic Acid (VITAMIN C PO) Take 1 tablet by mouth daily.     cetirizine (ZYRTEC) 10 MG tablet Take 10 mg by mouth daily.     Cholecalciferol (VITAMIN D-3 PO) Take 1 capsule by mouth daily.     diltiazem  (CARDIZEM ) 30 MG tablet Take 1 tablet (30 mg total) by mouth every 6 (six) hours as needed (FOR AFIB EPISODES). 30 tablet 11   famotidine  (PEPCID ) 20 MG tablet Take 20 mg by mouth daily.     fenofibrate 160 MG tablet Take 160 mg by mouth daily.     latanoprost  (XALATAN ) 0.005 % ophthalmic solution Place 1 drop into both eyes every evening.     metoprolol  succinate (TOPROL -XL) 25 MG 24 hr tablet TAKE ONE TABLET BY MOUTH DAILY 90 tablet 3   Multiple Vitamins-Minerals (CENTRUM SILVER 50+MEN) TABS Take 1 tablet by mouth daily.     Polyethyl Glycol-Propyl Glycol (SYSTANE HYDRATION PF OP) Place 1 drop into both eyes daily as needed (dry eye).     simvastatin  (ZOCOR ) 40 MG tablet Take 40 mg by mouth at bedtime.     No current facility-administered medications for this visit.     Review of Systems    ***.  All other systems reviewed and are otherwise negative except as noted above.    Physical  Exam    VS:  BP 124/72 (BP Location: Left Arm, Patient Position: Sitting, Cuff Size: Normal)   Pulse 74   Ht 5\' 8"  (1.727 m)   Wt 163 lb 3.2 oz (74 kg)   BMI 24.81 kg/m  GEN: Well nourished, well developed, in no acute distress. HEENT: normal. Neck: Supple, no JVD, carotid bruits, or masses. Cardiac: RRR, no murmurs, rubs, or gallops. No clubbing, cyanosis, edema.   Radials/DP/PT 2+ and equal bilaterally.  Respiratory:  Respirations regular and unlabored, clear to auscultation bilaterally. GI: Soft, nontender, nondistended, BS + x 4. MS: no deformity or atrophy. Skin: warm and dry, no rash. Neuro:  Strength and sensation are intact. Psych: Normal affect.  Accessory Clinical Findings    ECG personally reviewed by me today - EKG Interpretation Date/Time:  Thursday May 21 2023 10:56:02 EDT Ventricular Rate:  74 PR Interval:  214 QRS Duration:  80 QT Interval:  378 QTC Calculation: 419 R Axis:   -22  Text Interpretation: Sinus rhythm with 1st degree A-V block Septal infarct (cited on or before 13-Aug-2021) When compared with ECG of 27-Apr-2023 09:19, PR interval has increased Incomplete right bundle branch block is no longer Present Questionable change in initial forces of Septal leads Confirmed by Marlana Silvan ([id number]) on 05/21/2023 10:57:29 AM  - no acute changes.   Lab Results  Component Value Date   WBC 7.0 08/08/2021   HGB 16.2 08/08/2021   HCT 45.9 08/08/2021   MCV 90.0 08/08/2021   PLT 194 08/08/2021   Lab Results  Component Value Date   CREATININE 1.27 10/16/2021   BUN 15 10/16/2021   NA 131 (L) 10/16/2021   K 5.4 (H) 10/16/2021   CL 93 (L) 10/16/2021   CO2 26 10/16/2021   Lab Results  Component Value Date   ALT 20 07/22/2008   AST 22 07/22/2008   ALKPHOS 70 07/22/2008   BILITOT 0.8 07/22/2008   Lab Results  Component Value Date   CHOL 113 08/10/2021   HDL 56 08/10/2021   LDLCALC 39 08/10/2021   TRIG 90 08/10/2021   CHOLHDL 2.0 08/10/2021    Lab Results  Component Value Date   HGBA1C 5.8 (H) 08/09/2021    Assessment & Plan    1.  ***      Jude Norton, NP 05/21/2023, 10:58 AM

## 2023-05-21 ENCOUNTER — Ambulatory Visit: Attending: Nurse Practitioner | Admitting: Nurse Practitioner

## 2023-05-21 ENCOUNTER — Encounter: Payer: Self-pay | Admitting: Nurse Practitioner

## 2023-05-21 VITALS — BP 124/72 | HR 74 | Ht 68.0 in | Wt 163.2 lb

## 2023-05-21 DIAGNOSIS — I10 Essential (primary) hypertension: Secondary | ICD-10-CM | POA: Diagnosis not present

## 2023-05-21 DIAGNOSIS — I480 Paroxysmal atrial fibrillation: Secondary | ICD-10-CM

## 2023-05-21 DIAGNOSIS — I2510 Atherosclerotic heart disease of native coronary artery without angina pectoris: Secondary | ICD-10-CM

## 2023-05-21 DIAGNOSIS — E785 Hyperlipidemia, unspecified: Secondary | ICD-10-CM | POA: Diagnosis not present

## 2023-05-21 DIAGNOSIS — I495 Sick sinus syndrome: Secondary | ICD-10-CM

## 2023-05-21 DIAGNOSIS — Z950 Presence of cardiac pacemaker: Secondary | ICD-10-CM | POA: Diagnosis not present

## 2023-05-21 NOTE — Patient Instructions (Signed)
 Medication Instructions:  Your physician recommends that you continue on your current medications as directed. Please refer to the Current Medication list given to you today.  *If you need a refill on your cardiac medications before your next appointment, please call your pharmacy*  Lab Work: NONE ordered at this time of appointment    Testing/Procedures: NONE ordered at this time of appointment    Follow-Up: At [facility], you and your health needs are our priority.  As part of our continuing mission to provide you with exceptional heart care, our providers are all part of one team.  This team includes your primary Cardiologist (physician) and Advanced Practice Providers or APPs (Physician Assistants and Nurse Practitioners) who all work together to provide you with the care you need, when you need it.  Your next appointment:   1 year(s)  Provider:   Euell Herrlich, MD    We recommend signing up for the patient portal called "MyChart".  Sign up information is provided on this After Visit Summary.  MyChart is used to connect with patients for Virtual Visits (Telemedicine).  Patients are able to view lab/test results, encounter notes, upcoming appointments, etc.  Non-urgent messages can be sent to your provider as well.   To learn more about what you can do with MyChart, go to [email or web address].

## 2023-05-23 ENCOUNTER — Encounter: Payer: Self-pay | Admitting: Nurse Practitioner

## 2023-05-25 DIAGNOSIS — E1122 Type 2 diabetes mellitus with diabetic chronic kidney disease: Secondary | ICD-10-CM | POA: Diagnosis not present

## 2023-05-25 DIAGNOSIS — E782 Mixed hyperlipidemia: Secondary | ICD-10-CM | POA: Diagnosis not present

## 2023-05-25 DIAGNOSIS — K219 Gastro-esophageal reflux disease without esophagitis: Secondary | ICD-10-CM | POA: Diagnosis not present

## 2023-05-26 DIAGNOSIS — I4891 Unspecified atrial fibrillation: Secondary | ICD-10-CM | POA: Diagnosis not present

## 2023-05-26 DIAGNOSIS — H409 Unspecified glaucoma: Secondary | ICD-10-CM | POA: Diagnosis not present

## 2023-05-26 DIAGNOSIS — J309 Allergic rhinitis, unspecified: Secondary | ICD-10-CM | POA: Diagnosis not present

## 2023-05-26 DIAGNOSIS — K219 Gastro-esophageal reflux disease without esophagitis: Secondary | ICD-10-CM | POA: Diagnosis not present

## 2023-05-26 DIAGNOSIS — E782 Mixed hyperlipidemia: Secondary | ICD-10-CM | POA: Diagnosis not present

## 2023-05-26 DIAGNOSIS — E1122 Type 2 diabetes mellitus with diabetic chronic kidney disease: Secondary | ICD-10-CM | POA: Diagnosis not present

## 2023-06-19 ENCOUNTER — Other Ambulatory Visit: Payer: Self-pay | Admitting: Internal Medicine

## 2023-06-25 NOTE — Addendum Note (Signed)
 Addended by: Lott Rouleau A on: 06/25/2023 03:21 PM   Modules accepted: Orders

## 2023-06-25 NOTE — Progress Notes (Signed)
 Remote pacemaker transmission.

## 2023-06-29 ENCOUNTER — Other Ambulatory Visit: Payer: Self-pay

## 2023-06-29 MED ORDER — APIXABAN 5 MG PO TABS: 5.0000 mg | ORAL_TABLET | Freq: Two times a day (BID) | ORAL | 1 refills | Status: DC

## 2023-06-29 NOTE — Telephone Encounter (Signed)
 Prescription refill request for Eliquis  received. Indication:afib Last office visit:5/25 Scr:1.28  5/25 Age: 83 Weight:74  kg  Prescription refilled

## 2023-07-16 DIAGNOSIS — H401131 Primary open-angle glaucoma, bilateral, mild stage: Secondary | ICD-10-CM | POA: Diagnosis not present

## 2023-07-16 DIAGNOSIS — Z961 Presence of intraocular lens: Secondary | ICD-10-CM | POA: Diagnosis not present

## 2023-07-16 DIAGNOSIS — H04123 Dry eye syndrome of bilateral lacrimal glands: Secondary | ICD-10-CM | POA: Diagnosis not present

## 2023-07-16 DIAGNOSIS — E119 Type 2 diabetes mellitus without complications: Secondary | ICD-10-CM | POA: Diagnosis not present

## 2023-08-11 ENCOUNTER — Ambulatory Visit: Payer: Medicare PPO

## 2023-08-11 DIAGNOSIS — I495 Sick sinus syndrome: Secondary | ICD-10-CM

## 2023-08-12 LAB — CUP PACEART REMOTE DEVICE CHECK
Battery Remaining Longevity: 168 mo
Battery Remaining Percentage: 100 %
Brady Statistic RA Percent Paced: 6 %
Brady Statistic RV Percent Paced: 0 %
Date Time Interrogation Session: 20250722043200
Implantable Lead Connection Status: 753985
Implantable Lead Connection Status: 753985
Implantable Lead Implant Date: 20230724
Implantable Lead Implant Date: 20230724
Implantable Lead Location: 753859
Implantable Lead Location: 753860
Implantable Lead Model: 7840
Implantable Lead Model: 7841
Implantable Lead Serial Number: 1061671
Implantable Lead Serial Number: 1297615
Implantable Pulse Generator Implant Date: 20230724
Lead Channel Impedance Value: 631 Ohm
Lead Channel Impedance Value: 685 Ohm
Lead Channel Pacing Threshold Amplitude: 0.8 V
Lead Channel Pacing Threshold Amplitude: 1.1 V
Lead Channel Pacing Threshold Pulse Width: 0.4 ms
Lead Channel Pacing Threshold Pulse Width: 0.4 ms
Lead Channel Setting Pacing Amplitude: 2 V
Lead Channel Setting Pacing Amplitude: 2.5 V
Lead Channel Setting Pacing Pulse Width: 0.4 ms
Lead Channel Setting Sensing Sensitivity: 2.5 mV
Pulse Gen Serial Number: 597897
Zone Setting Status: 755011

## 2023-08-13 ENCOUNTER — Ambulatory Visit: Payer: Self-pay | Admitting: Cardiology

## 2023-08-14 ENCOUNTER — Other Ambulatory Visit: Payer: Self-pay | Admitting: Internal Medicine

## 2023-08-17 ENCOUNTER — Encounter: Payer: Self-pay | Admitting: Internal Medicine

## 2023-08-17 MED ORDER — METOPROLOL SUCCINATE ER 25 MG PO TB24: 25.0000 mg | ORAL_TABLET | Freq: Every day | ORAL | 3 refills | Status: AC

## 2023-08-17 NOTE — Telephone Encounter (Signed)
 Per OV note by Damien Braver, PA on 05/21/23:  1. Nonobstructive CAD:  Coronary CTA in 10/2021 showed coronary calcium score 166 (33rd percentile). Stable with no anginal symptoms. No indication for ischemic evaluation.  No ASA in the setting of chronic DOAC therapy.  Continue metoprolol , simvastatin .   Refill sent to requested pharmacy at this time.

## 2023-10-23 NOTE — Progress Notes (Signed)
 Remote PPM Transmission

## 2023-10-28 ENCOUNTER — Ambulatory Visit (HOSPITAL_COMMUNITY)
Admission: RE | Admit: 2023-10-28 | Discharge: 2023-10-28 | Disposition: A | Discharge status: Home / Self Care | Source: Ambulatory Visit | Attending: Physician Assistant | Admitting: Physician Assistant

## 2023-10-28 VITALS — BP 112/64 | HR 72 | Ht 68.0 in | Wt 160.0 lb

## 2023-10-28 DIAGNOSIS — D6869 Other thrombophilia: Secondary | ICD-10-CM | POA: Diagnosis not present

## 2023-10-28 DIAGNOSIS — I480 Paroxysmal atrial fibrillation: Secondary | ICD-10-CM

## 2023-10-28 NOTE — Progress Notes (Signed)
 Primary Care Physician: William Cole, MD Primary Cardiologist: William DELENA Merck, MD Electrophysiologist: William ONEIDA HOLTS, MD  Referring Physician: Dr Daniels William Daniels is a 83 y.o. male with a history of SSS s/p PPM, HTN, CAD, HLD, atrial fibrillation who presents for follow up in the [facility].  The patient was initially diagnosed with atrial fibrillation 07/2021 after presenting to the ED with chest pain. He was found to be in afib with RVR and started on BB. He had a syncopal event with a 13 second post termination pause, now s/p PPM. Patient is on Eliquis  for stroke prevention. Patient sent a message to the device clinic reporting symptoms of afib. Device interrogation showed brief but frequent episodes of afib, some with RVR.   Patient returns for follow up for atrial fibrillation. He reports that he has done well since his last visit with only one episode of symptomatic afib. There were no specific triggers that he could identify. No bleeding issues on anticoagulation.   Today, he  denies symptoms of palpitations, chest pain, shortness of breath, orthopnea, PND, lower extremity edema, dizziness, presyncope, syncope, snoring, daytime somnolence, bleeding, or neurologic sequela. The patient is tolerating medications without difficulties and is otherwise without complaint today.    Atrial Fibrillation Risk Factors:  he does not have symptoms or diagnosis of sleep apnea. he does not have a history of rheumatic fever. he does not have a history of alcohol use. The patient does not have a history of early familial atrial fibrillation or other arrhythmias.  Atrial Fibrillation Management history:  Previous antiarrhythmic drugs: none Previous cardioversions: none Previous ablations: none Anticoagulation history: Eliquis   ROS- All systems are reviewed and negative except as per the HPI above.  Past Medical History:  Diagnosis Date   Glaucoma     Hyperlipidemia    Hypertension     Current Outpatient Medications  Medication Sig Dispense Refill   ACCU-CHEK AVIVA PLUS test strip 1 each by Other route as needed for other.     acetaminophen  (TYLENOL ) 500 MG tablet Take 1,000 mg by mouth every 6 (six) hours as needed for mild pain. (Patient taking differently: Take 1,000 mg by mouth as needed for mild pain (pain score 1-3).)     apixaban  (ELIQUIS ) 5 MG TABS tablet Take 1 tablet (5 mg total) by mouth 2 (two) times daily. 180 tablet 1   Ascorbic Acid (VITAMIN C PO) Take 1 tablet by mouth daily.     cetirizine (ZYRTEC) 10 MG tablet Take 10 mg by mouth daily.     Cholecalciferol (VITAMIN D-3 PO) Take 1 capsule by mouth daily.     diltiazem  (CARDIZEM ) 30 MG tablet Take 1 tablet (30 mg total) by mouth every 6 (six) hours as needed (FOR AFIB EPISODES). 30 tablet 11   famotidine  (PEPCID ) 20 MG tablet Take 20 mg by mouth daily.     fenofibrate 160 MG tablet Take 160 mg by mouth daily.     latanoprost  (XALATAN ) 0.005 % ophthalmic solution Place 1 drop into both eyes every evening.     metoprolol  succinate (TOPROL -XL) 25 MG 24 hr tablet Take 1 tablet (25 mg total) by mouth daily. 90 tablet 3   Multiple Vitamins-Minerals (CENTRUM SILVER 50+MEN) TABS Take 1 tablet by mouth daily.     Polyethyl Glycol-Propyl Glycol (SYSTANE HYDRATION PF OP) Place 1 drop into both eyes daily as needed (dry eye).     simvastatin  (ZOCOR ) 40 MG tablet Take 40  mg by mouth at bedtime.     No current facility-administered medications for this encounter.    Physical Exam: BP 112/64   Pulse 72   Ht 5' 8 (1.727 m)   Wt 72.6 kg   BMI 24.33 kg/m   GEN: Well nourished, well developed in no acute distress CARDIAC: Regular rate and rhythm, no murmurs, rubs, gallops RESPIRATORY:  Clear to auscultation without rales, wheezing or rhonchi  ABDOMEN: Soft, non-tender, non-distended EXTREMITIES:  No edema; No deformity    Wt Readings from Last 3 Encounters:  10/28/23 72.6 kg   05/21/23 74 kg  04/27/23 74 kg     EKG today demonstrates  SR, PVC, intermittent A pacing Vent. rate 72 BPM PR interval 192 ms QRS duration 92 ms QT/QTcB 386/422 ms   Echo 10/11/21 demonstrated   1. Left ventricular ejection fraction, by estimation, is 60 to 65%. The  left ventricle has normal function. The left ventricle has no regional  wall motion abnormalities. There is mild left ventricular hypertrophy.   2. Right ventricular systolic function is normal. The right ventricular  size is normal. There is normal pulmonary artery systolic pressure.   3. The mitral valve is abnormal. Mild to moderate mitral valve  regurgitation. No evidence of mitral stenosis.   4. Tricuspid valve regurgitation is moderate.   5. Aortic dilatation noted. There is borderline dilatation of the  ascending aorta, measuring 39 mm.   6. The inferior vena cava is normal in size with greater than 50%  respiratory variability, suggesting right atrial pressure of 3 mmHg.   Comparison(s): No significant change from prior study.    CHA2DS2-VASc Score = 4  The patient's score is based upon: CHF History: 0 HTN History: 1 Diabetes History: 0 Stroke History: 0 Vascular Disease History: 1 Age Score: 2 Gender Score: 0       ASSESSMENT AND PLAN: Paroxysmal Atrial Fibrillation (ICD10:  I48.0) The patient's CHA2DS2-VASc score is 4, indicating a 4.8% annual risk of stroke.   Patient in SR today, low burden of afib. Continue Eliquis  5 mg BID Continue Toprol  25 mg daily If he has more frequent afib, can consider Multaq, dofetilide, amiodarone, or ablation.   Secondary Hypercoagulable State (ICD10:  D68.69) The patient is at significant risk for stroke/thromboembolism based upon his CHA2DS2-VASc Score of 4.  Continue Apixaban  (Eliquis ). No bleeding issues.   HTN Stable on current regimen  SSS S/p PPM, followed by Dr William Daniels  CAD No anginal symptoms Followed by Dr William Daniels    Follow up with Dr  William Daniels per recall.      William Kicks PA-C Afib [facility] [address] [place], [place] [address] [phone]

## 2023-11-02 DIAGNOSIS — Z85828 Personal history of other malignant neoplasm of skin: Secondary | ICD-10-CM | POA: Diagnosis not present

## 2023-11-02 DIAGNOSIS — L821 Other seborrheic keratosis: Secondary | ICD-10-CM | POA: Diagnosis not present

## 2023-11-02 DIAGNOSIS — D225 Melanocytic nevi of trunk: Secondary | ICD-10-CM | POA: Diagnosis not present

## 2023-11-02 DIAGNOSIS — L570 Actinic keratosis: Secondary | ICD-10-CM | POA: Diagnosis not present

## 2023-11-02 DIAGNOSIS — D045 Carcinoma in situ of skin of trunk: Secondary | ICD-10-CM | POA: Diagnosis not present

## 2023-11-02 DIAGNOSIS — L814 Other melanin hyperpigmentation: Secondary | ICD-10-CM | POA: Diagnosis not present

## 2023-11-02 DIAGNOSIS — L578 Other skin changes due to chronic exposure to nonionizing radiation: Secondary | ICD-10-CM | POA: Diagnosis not present

## 2023-11-02 DIAGNOSIS — D485 Neoplasm of uncertain behavior of skin: Secondary | ICD-10-CM | POA: Diagnosis not present

## 2023-11-10 ENCOUNTER — Ambulatory Visit: Payer: Medicare PPO | Attending: Cardiology

## 2023-11-10 DIAGNOSIS — I495 Sick sinus syndrome: Secondary | ICD-10-CM

## 2023-11-12 LAB — CUP PACEART REMOTE DEVICE CHECK
Battery Remaining Longevity: 174 mo
Battery Remaining Percentage: 100 %
Brady Statistic RA Percent Paced: 6 %
Brady Statistic RV Percent Paced: 0 %
Date Time Interrogation Session: 20251021043100
Implantable Lead Connection Status: 753985
Implantable Lead Connection Status: 753985
Implantable Lead Implant Date: 20230724
Implantable Lead Implant Date: 20230724
Implantable Lead Location: 753859
Implantable Lead Location: 753860
Implantable Lead Model: 7840
Implantable Lead Model: 7841
Implantable Lead Serial Number: 1061671
Implantable Lead Serial Number: 1297615
Implantable Pulse Generator Implant Date: 20230724
Lead Channel Impedance Value: 600 Ohm
Lead Channel Impedance Value: 648 Ohm
Lead Channel Pacing Threshold Amplitude: 0.7 V
Lead Channel Pacing Threshold Amplitude: 1.2 V
Lead Channel Pacing Threshold Pulse Width: 0.4 ms
Lead Channel Pacing Threshold Pulse Width: 0.4 ms
Lead Channel Setting Pacing Amplitude: 2 V
Lead Channel Setting Pacing Amplitude: 2.5 V
Lead Channel Setting Pacing Pulse Width: 0.4 ms
Lead Channel Setting Sensing Sensitivity: 2.5 mV
Pulse Gen Serial Number: 597897
Zone Setting Status: 755011

## 2023-11-13 NOTE — Progress Notes (Signed)
 Remote PPM Transmission

## 2023-11-16 ENCOUNTER — Ambulatory Visit: Payer: Self-pay | Admitting: Cardiology

## 2023-11-22 ENCOUNTER — Other Ambulatory Visit: Payer: Self-pay | Admitting: Internal Medicine

## 2023-11-26 DIAGNOSIS — D045 Carcinoma in situ of skin of trunk: Secondary | ICD-10-CM | POA: Diagnosis not present

## 2023-11-27 ENCOUNTER — Encounter: Payer: Self-pay | Admitting: Internal Medicine

## 2023-11-27 DIAGNOSIS — N183 Chronic kidney disease, stage 3 unspecified: Secondary | ICD-10-CM | POA: Diagnosis not present

## 2023-11-27 DIAGNOSIS — E782 Mixed hyperlipidemia: Secondary | ICD-10-CM | POA: Diagnosis not present

## 2023-11-27 DIAGNOSIS — E1122 Type 2 diabetes mellitus with diabetic chronic kidney disease: Secondary | ICD-10-CM | POA: Diagnosis not present

## 2023-11-27 DIAGNOSIS — M8580 Other specified disorders of bone density and structure, unspecified site: Secondary | ICD-10-CM | POA: Diagnosis not present

## 2023-11-27 DIAGNOSIS — K219 Gastro-esophageal reflux disease without esophagitis: Secondary | ICD-10-CM | POA: Diagnosis not present

## 2023-11-30 ENCOUNTER — Other Ambulatory Visit: Payer: Self-pay | Admitting: *Deleted

## 2023-11-30 DIAGNOSIS — Z23 Encounter for immunization: Secondary | ICD-10-CM | POA: Diagnosis not present

## 2023-11-30 DIAGNOSIS — Z0000 Encounter for general adult medical examination without abnormal findings: Secondary | ICD-10-CM | POA: Diagnosis not present

## 2023-11-30 DIAGNOSIS — E1122 Type 2 diabetes mellitus with diabetic chronic kidney disease: Secondary | ICD-10-CM | POA: Diagnosis not present

## 2023-11-30 DIAGNOSIS — N183 Chronic kidney disease, stage 3 unspecified: Secondary | ICD-10-CM | POA: Diagnosis not present

## 2023-11-30 DIAGNOSIS — K219 Gastro-esophageal reflux disease without esophagitis: Secondary | ICD-10-CM | POA: Diagnosis not present

## 2023-11-30 DIAGNOSIS — H9190 Unspecified hearing loss, unspecified ear: Secondary | ICD-10-CM | POA: Diagnosis not present

## 2023-11-30 DIAGNOSIS — M8580 Other specified disorders of bone density and structure, unspecified site: Secondary | ICD-10-CM | POA: Diagnosis not present

## 2023-11-30 DIAGNOSIS — E782 Mixed hyperlipidemia: Secondary | ICD-10-CM | POA: Diagnosis not present

## 2023-11-30 MED ORDER — APIXABAN 5 MG PO TABS: 5.0000 mg | ORAL_TABLET | Freq: Two times a day (BID) | ORAL | 0 refills | Status: DC

## 2023-11-30 NOTE — Telephone Encounter (Signed)
 Prescription refill request for Eliquis  received. Indication: PAF Last office visit: 10/28/23  C Fenton PA Scr: 1.30 on 05/15/22  Epic Age: 83 Weight: 72.6kg  Based on above findings Eliquis  5mg  twice daily is the appropriate dose.  Pt is due for a pacer check and updated labs.  Message sent to schedulers.  Refill approved x 1.

## 2023-12-10 ENCOUNTER — Encounter: Payer: Self-pay | Admitting: Pulmonary Disease

## 2023-12-10 NOTE — Progress Notes (Signed)
  Electrophysiology Office Note:   Date:  12/11/2023  ID:  William Daniels, DOB May 10, 1940, MRN [id number]  Primary Cardiologist: Soyla DELENA Merck, MD Primary Heart Failure: None Electrophysiologist: OLE ONEIDA HOLTS, MD       History of Present Illness:   William Daniels is a 83 y.o. male with h/o SND s/p PPM, AF (post termination pause of 13 sec), HTN, HLD seen today for routine electrophysiology followup.   Since last being seen in our clinic the patient reports doing well overall. They just adopted a 26 year old dog named Burman. It has got them out walking.  He denies device concerns / no acute issues. His wife accompanies him to the visit.  He denies chest pain, palpitations, dyspnea, PND, orthopnea, nausea, vomiting, dizziness, syncope, edema, weight gain, or early satiety.   Review of systems complete and found to be negative unless listed in HPI.    EP Information / Studies Reviewed:    EKG is not ordered today. EKG from 10/28/23 reviewed which showed NSR 72 bpm with occ A-pacing       PPM Interrogation-  reviewed in detail today,  See PACEART report.  Device History: Field Seismologist PPM implanted 08/12/21 for Sinus Node Dysfunction  Risk Assessment/Calculations:    CHA2DS2-VASc Score = 4   This indicates a 4.8% annual risk of stroke. The patient's score is based upon: CHF History: 0 HTN History: 1 Diabetes History: 0 Stroke History: 0 Vascular Disease History: 1 Age Score: 2 Gender Score: 0             Physical Exam:   VS:  BP 111/70   Pulse 68   Ht 5' 8 (1.727 m)   Wt 161 lb (73 kg)   SpO2 97%   BMI 24.48 kg/m    Wt Readings from Last 3 Encounters:  12/11/23 161 lb (73 kg)  10/28/23 160 lb (72.6 kg)  05/21/23 163 lb 3.2 oz (74 kg)     GEN: Well nourished, well developed in no acute distress NECK: No JVD; No carotid bruits CARDIAC: Regular rate and rhythm, no murmurs, rubs, gallops. Device site wnl, no tethering.  RESPIRATORY:  Clear  to auscultation without rales, wheezing or rhonchi  ABDOMEN: Soft, non-tender, non-distended EXTREMITIES:  No edema; No deformity   ASSESSMENT AND PLAN:    SND s/p Environmental Manager PPM  -Normal PPM function -See Pace Art report -No changes today  Paroxysmal Atrial Fibrillation  CHA2DS2-VASc 6  -OAC for stroke prophylaxis  -Toprol  25 mg daily  -episodes binned as SVT / NSVT > AFwRVR / AFL on device, <1% burden. Importance of OAC reviewed with patient -discussed LAAO concept with patient    Secondary Hypercoagulable State  -continue Eliquis  5mg  BID, dose reviewed and appropriate by wt / Cr  -BMP at [facility] >  Cr 1.35 11/27/23   Hypertension  -well controlled on current regimen    CAD  -no anginal symptoms   -follows with Dr. Merck    Disposition:   Follow up with EP APP in 12 months > will need to transition to new EP MD   Signed, Daphne Barrack, NP-C, AGACNP-BC [facility] - Electrophysiology  12/11/2023, 2:38 PM

## 2023-12-11 ENCOUNTER — Encounter: Payer: Self-pay | Admitting: Pulmonary Disease

## 2023-12-11 ENCOUNTER — Ambulatory Visit: Attending: Pulmonary Disease | Admitting: Pulmonary Disease

## 2023-12-11 VITALS — BP 111/70 | HR 68 | Ht 68.0 in | Wt 161.0 lb

## 2023-12-11 DIAGNOSIS — D6869 Other thrombophilia: Secondary | ICD-10-CM | POA: Diagnosis not present

## 2023-12-11 DIAGNOSIS — I2510 Atherosclerotic heart disease of native coronary artery without angina pectoris: Secondary | ICD-10-CM | POA: Diagnosis not present

## 2023-12-11 DIAGNOSIS — I10 Essential (primary) hypertension: Secondary | ICD-10-CM

## 2023-12-11 DIAGNOSIS — I495 Sick sinus syndrome: Secondary | ICD-10-CM

## 2023-12-11 DIAGNOSIS — Z950 Presence of cardiac pacemaker: Secondary | ICD-10-CM | POA: Diagnosis not present

## 2023-12-11 DIAGNOSIS — I480 Paroxysmal atrial fibrillation: Secondary | ICD-10-CM

## 2023-12-11 LAB — CUP PACEART INCLINIC DEVICE CHECK
Date Time Interrogation Session: 20251121143405
Implantable Lead Connection Status: 753985
Implantable Lead Connection Status: 753985
Implantable Lead Implant Date: 20230724
Implantable Lead Implant Date: 20230724
Implantable Lead Location: 753859
Implantable Lead Location: 753860
Implantable Lead Model: 7840
Implantable Lead Model: 7841
Implantable Lead Serial Number: 1061671
Implantable Lead Serial Number: 1297615
Implantable Pulse Generator Implant Date: 20230724
Pulse Gen Serial Number: 597897

## 2023-12-11 NOTE — Patient Instructions (Signed)
 Medication Instructions:  Your physician recommends that you continue on your current medications as directed. Please refer to the Current Medication list given to you today.  *If you need a refill on your cardiac medications before your next appointment, please call your pharmacy*  Lab Work: None ordered If you have labs (blood work) drawn today and your tests are completely normal, you will receive your results only by: MyChart Message (if you have MyChart) OR A paper copy in the mail If you have any lab test that is abnormal or we need to change your treatment, we will call you to review the results.  Follow-Up: At [facility], you and your health needs are our priority.  As part of our continuing mission to provide you with exceptional heart care, our providers are all part of one team.  This team includes your primary Cardiologist (physician) and Advanced Practice Providers or APPs (Physician Assistants and Nurse Practitioners) who all work together to provide you with the care you need, when you need it.  Your next appointment:   1 year(s)  Provider:   You will see one of the following Advanced Practice Providers on your designated Care Team:   Charlies Arthur, [place] Jodie Passey, PA-C Suzann Riddle, NP Daphne Barrack, NP Artist Pouch, PA-C

## 2023-12-15 ENCOUNTER — Ambulatory Visit: Payer: Self-pay | Admitting: Cardiology

## 2024-01-04 ENCOUNTER — Other Ambulatory Visit: Payer: Self-pay | Admitting: Student

## 2024-02-08 ENCOUNTER — Other Ambulatory Visit: Payer: Self-pay | Admitting: Internal Medicine

## 2024-02-09 ENCOUNTER — Ambulatory Visit: Payer: Medicare PPO

## 2024-02-09 DIAGNOSIS — I495 Sick sinus syndrome: Secondary | ICD-10-CM | POA: Diagnosis not present

## 2024-02-10 LAB — CUP PACEART REMOTE DEVICE CHECK
Battery Remaining Longevity: 162 mo
Battery Remaining Percentage: 100 %
Brady Statistic RA Percent Paced: 6 %
Brady Statistic RV Percent Paced: 1 %
Date Time Interrogation Session: 20260120043100
Implantable Lead Connection Status: 753985
Implantable Lead Connection Status: 753985
Implantable Lead Implant Date: 20230724
Implantable Lead Implant Date: 20230724
Implantable Lead Location: 753859
Implantable Lead Location: 753860
Implantable Lead Model: 7840
Implantable Lead Model: 7841
Implantable Lead Serial Number: 1061671
Implantable Lead Serial Number: 1297615
Implantable Pulse Generator Implant Date: 20230724
Lead Channel Impedance Value: 611 Ohm
Lead Channel Impedance Value: 637 Ohm
Lead Channel Pacing Threshold Amplitude: 0.7 V
Lead Channel Pacing Threshold Amplitude: 1.3 V
Lead Channel Pacing Threshold Pulse Width: 0.4 ms
Lead Channel Pacing Threshold Pulse Width: 0.4 ms
Lead Channel Setting Pacing Amplitude: 2 V
Lead Channel Setting Pacing Amplitude: 2.5 V
Lead Channel Setting Pacing Pulse Width: 0.4 ms
Lead Channel Setting Sensing Sensitivity: 2.5 mV
Pulse Gen Serial Number: 597897
Zone Setting Status: 755011

## 2024-02-12 ENCOUNTER — Encounter: Payer: Self-pay | Admitting: Internal Medicine

## 2024-02-12 NOTE — Progress Notes (Signed)
 Remote PPM Transmission

## 2024-02-15 ENCOUNTER — Ambulatory Visit: Payer: Self-pay | Admitting: Cardiology

## 2024-02-15 MED ORDER — APIXABAN 5 MG PO TABS: 5.0000 mg | ORAL_TABLET | Freq: Two times a day (BID) | ORAL | 1 refills | Status: AC

## 2024-05-10 ENCOUNTER — Ambulatory Visit: Payer: Medicare PPO

## 2024-08-09 ENCOUNTER — Ambulatory Visit: Payer: Medicare PPO

## 2024-11-08 ENCOUNTER — Ambulatory Visit: Payer: Medicare PPO

## 2025-02-07 ENCOUNTER — Ambulatory Visit: Payer: Medicare PPO
